# Patient Record
Sex: Male | Born: 1990 | Race: White | Hispanic: No | Marital: Single | Smoking: Current every day smoker
Health system: Southern US, Community
[De-identification: ages and names within clinical notes are randomized; demographics above are authoritative.]

## PROBLEM LIST (undated history)

## (undated) HISTORY — PX: FINGER SURGERY: SHX640

---
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
## 2011-02-10 LAB — CBC
HCT: 44.1
Hemoglobin: 14.9
MCHC: 33.8
MCV: 88.3
Platelets: 190
RBC: 5
RDW: 13.6
WBC: 6.8

## 2011-02-10 LAB — LIPID PANEL
Triglycerides: 174 — ABNORMAL HIGH
VLDL: 35

## 2011-02-10 LAB — COMPREHENSIVE METABOLIC PANEL
ALT: 26
AST: 22
Albumin: 4
Alkaline Phosphatase: 123
BUN: 12
CO2: 27
Calcium: 9.4
Chloride: 103
Creatinine, Ser: 0.88
Glucose, Bld: 104 — ABNORMAL HIGH
Potassium: 3.8
Sodium: 139
Total Bilirubin: 0.3
Total Protein: 7.1

## 2011-02-10 LAB — GAMMA GT: GGT: 21

## 2011-02-10 LAB — DIFFERENTIAL
Basophils Absolute: 0
Basophils Relative: 1
Eosinophils Absolute: 0.3
Eosinophils Relative: 5
Lymphocytes Relative: 37
Lymphs Abs: 2.5
Monocytes Absolute: 0.4
Monocytes Relative: 6
Neutro Abs: 3.5
Neutrophils Relative %: 51

## 2011-02-10 LAB — URINALYSIS, ROUTINE W REFLEX MICROSCOPIC
Bilirubin Urine: NEGATIVE
Glucose, UA: NEGATIVE
Hgb urine dipstick: NEGATIVE
Ketones, ur: NEGATIVE
Nitrite: NEGATIVE
Protein, ur: NEGATIVE
Specific Gravity, Urine: 1.04 — ABNORMAL HIGH
Urobilinogen, UA: 0.2
pH: 6

## 2011-02-10 LAB — RAPID URINE DRUG SCREEN, HOSP PERFORMED
Amphetamines: NOT DETECTED
Barbiturates: NOT DETECTED
Benzodiazepines: NOT DETECTED
Cocaine: NOT DETECTED
Opiates: NOT DETECTED
Tetrahydrocannabinol: NOT DETECTED

## 2011-02-10 LAB — ETHANOL: Alcohol, Ethyl (B): <5

## 2011-02-10 LAB — HEMOGLOBIN A1C
Hgb A1c MFr Bld: 5.7
Mean Plasma Glucose: 117

## 2011-02-10 LAB — TSH: TSH: 2.257

## 2011-08-25 ENCOUNTER — Encounter (HOSPITAL_COMMUNITY): Payer: Self-pay

## 2011-08-25 ENCOUNTER — Emergency Department (HOSPITAL_COMMUNITY)
Admission: EM | Admit: 2011-08-25 | Discharge: 2011-08-25 | Disposition: A | Discharge status: Home / Self Care | Payer: Self-pay | Attending: Emergency Medicine | Admitting: Emergency Medicine

## 2011-08-25 DIAGNOSIS — N489 Disorder of penis, unspecified: Secondary | ICD-10-CM | POA: Insufficient documentation

## 2011-08-25 DIAGNOSIS — N4830 Priapism, unspecified: Secondary | ICD-10-CM

## 2011-08-25 LAB — BASIC METABOLIC PANEL
BUN: 17 mg/dL (ref 6–23)
CO2: 26 mEq/L (ref 19–32)
Chloride: 104 mEq/L (ref 96–112)
Creatinine, Ser: 0.92 mg/dL (ref 0.50–1.35)
GFR calc Af Amer: >90 mL/min (ref >90)
Glucose, Bld: 106 mg/dL — ABNORMAL HIGH (ref 70–99)
Potassium: 3.9 mEq/L (ref 3.5–5.1)

## 2011-08-25 LAB — DIFFERENTIAL
Lymphocytes Relative: 45 % (ref 12–46)
Lymphs Abs: 2.3 10*3/uL (ref 0.7–4.0)
Monocytes Absolute: 0.4 10*3/uL (ref 0.1–1.0)
Monocytes Relative: 7 % (ref 3–12)
Neutro Abs: 2.2 10*3/uL (ref 1.7–7.7)
Neutrophils Relative %: 45 % (ref 43–77)

## 2011-08-25 LAB — CBC
HCT: 44 % (ref 39.0–52.0)
Hemoglobin: 15.7 g/dL (ref 13.0–17.0)
MCHC: 35.7 g/dL (ref 30.0–36.0)
RBC: 5.02 MIL/uL (ref 4.22–5.81)

## 2011-08-25 MED ORDER — HYDROCODONE-ACETAMINOPHEN 5-325 MG PO TABS
1.0000 | ORAL_TABLET | Freq: Once | ORAL | Status: AC
  Administered 2011-08-25: 1 via ORAL
  Filled 2011-08-25: qty 1

## 2011-08-25 NOTE — ED Notes (Signed)
Patient presents with penile pain and erection since waking this morning. Patient believe his erection was present all night while he was sleeping. Patient reports erection has decreased but is still present and painful.  Patient reporting clera discharge to penile region.

## 2011-08-25 NOTE — Discharge Instructions (Signed)
Return if problems

## 2011-08-25 NOTE — ED Notes (Signed)
Pt states woke up w/ errection that he states he thinks has been there for a few hours painful he states has a d/c

## 2011-08-25 NOTE — ED Notes (Signed)
Pt d/c home in NAD. Pt voiced understanding of d/c instrructions. Pt informed that he will receive a call if GC/Chlamydia is positive.

## 2011-08-25 NOTE — ED Notes (Signed)
MD at bedside. 

## 2011-08-25 NOTE — ED Provider Notes (Signed)
History     CSN: [id number]  Arrival date & time 08/25/11  0807   First MD Initiated Contact with Patient 08/25/11 [phone]      Chief Complaint  Patient presents with  . Penis Pain    (Consider location/radiation/quality/duration/timing/severity/associated sxs/prior treatment) Patient is a 21 y.o. male presenting with penile pain. The history is provided by the patient (The patient states that he awoke this morning with an erection. Direction does not go to). No language interpreter was used.  Penis Pain This is a new problem. The current episode started 1 to 2 hours ago. The problem occurs constantly. The problem has been gradually improving. Pertinent negatives include no chest pain, no abdominal pain and no headaches. The symptoms are aggravated by nothing. The symptoms are relieved by nothing. The treatment provided no relief.    History reviewed. No pertinent past medical history.  History reviewed. No pertinent past surgical history.  No family history on file.  History  Substance Use Topics  . Smoking status: Never Smoker   . Smokeless tobacco: Not on file  . Alcohol Use: Yes      Review of Systems  Constitutional: Negative for fatigue.  HENT: Negative for congestion, sinus pressure and ear discharge.   Eyes: Negative for discharge.  Respiratory: Negative for cough.   Cardiovascular: Negative for chest pain.  Gastrointestinal: Negative for abdominal pain and diarrhea.  Genitourinary: Positive for penile pain. Negative for frequency and hematuria.  Musculoskeletal: Negative for back pain.  Skin: Negative for rash.  Neurological: Negative for seizures and headaches.  Hematological: Negative.   Psychiatric/Behavioral: Negative for hallucinations.    Allergies  Review of patient's allergies indicates no known allergies.  Home Medications   Current Outpatient Rx  Name Route Sig Dispense Refill  . CALCIUM PO Oral Take 1 tablet by mouth daily.    . L-ARGININE PO  Oral Take 1 tablet by mouth daily.    Marland Kitchen MAGNESIUM PO Oral Take 1 tablet by mouth daily.    . ADULT MULTIVITAMIN W/MINERALS CH Oral Take 1 tablet by mouth daily.    Marland Kitchen ZINC PO Oral Take 1 tablet by mouth daily.    Marland Kitchen VITAMIN E PO Oral Take 1 tablet by mouth daily.      BP 121/72  Pulse 63  Temp 98.1 F (36.7 C)  Resp 20  SpO2 98%  Physical Exam  Constitutional: He is oriented to person, place, and time. He appears well-developed.  HENT:  Head: Normocephalic and atraumatic.  Eyes: Conjunctivae and EOM are normal. No scleral icterus.  Neck: Neck supple. No thyromegaly present.  Cardiovascular: Normal rate and regular rhythm.  Exam reveals no gallop and no friction rub.   No murmur heard. Pulmonary/Chest: No stridor. He has no wheezes. He has no rales. He exhibits no tenderness.  Abdominal: He exhibits no distension. There is no tenderness. There is no rebound.  Musculoskeletal: Normal range of motion. He exhibits no edema.  Lymphadenopathy:    He has no cervical adenopathy.  Neurological: He is oriented to person, place, and time. Coordination normal.  Skin: No rash noted. No erythema.  Psychiatric: He has a normal mood and affect. His behavior is normal.    ED Course  Procedures (including critical care time)  Labs Reviewed  BASIC METABOLIC PANEL - Abnormal; Notable for the following:    Glucose, Bld 106 (*)    All other components within normal limits  CBC  DIFFERENTIAL  GC/CHLAMYDIA PROBE AMP, GENITAL   No  results found.   1. Priapism     Resolved priapism  MDM          Benny Lennert, MD 08/25/11 1148

## 2018-01-03 ENCOUNTER — Inpatient Hospital Stay (HOSPITAL_COMMUNITY)
Admission: EM | Admit: 2018-01-03 | Discharge: 2018-01-10 | DRG: 493 | Disposition: A | Discharge status: Home Health | Payer: Self-pay | Attending: Student | Admitting: Student

## 2018-01-03 ENCOUNTER — Emergency Department (HOSPITAL_COMMUNITY): Payer: Self-pay | Admitting: Certified Registered Nurse Anesthetist

## 2018-01-03 ENCOUNTER — Encounter (HOSPITAL_COMMUNITY)
Admission: EM | Disposition: A | Discharge status: Home Health | Payer: Self-pay | Source: Home / Self Care | Attending: Student

## 2018-01-03 ENCOUNTER — Emergency Department (HOSPITAL_COMMUNITY): Payer: Self-pay

## 2018-01-03 ENCOUNTER — Other Ambulatory Visit: Payer: Self-pay

## 2018-01-03 ENCOUNTER — Encounter (HOSPITAL_COMMUNITY): Payer: Self-pay

## 2018-01-03 ENCOUNTER — Inpatient Hospital Stay (HOSPITAL_COMMUNITY): Payer: Self-pay

## 2018-01-03 DIAGNOSIS — Z6838 Body mass index (BMI) 38.0-38.9, adult: Secondary | ICD-10-CM

## 2018-01-03 DIAGNOSIS — R200 Anesthesia of skin: Secondary | ICD-10-CM | POA: Diagnosis present

## 2018-01-03 DIAGNOSIS — S82401A Unspecified fracture of shaft of right fibula, initial encounter for closed fracture: Secondary | ICD-10-CM | POA: Diagnosis present

## 2018-01-03 DIAGNOSIS — F17220 Nicotine dependence, chewing tobacco, uncomplicated: Secondary | ICD-10-CM | POA: Diagnosis present

## 2018-01-03 DIAGNOSIS — T148XXA Other injury of unspecified body region, initial encounter: Secondary | ICD-10-CM

## 2018-01-03 DIAGNOSIS — S82142A Displaced bicondylar fracture of left tibia, initial encounter for closed fracture: Secondary | ICD-10-CM

## 2018-01-03 DIAGNOSIS — S82121A Displaced fracture of lateral condyle of right tibia, initial encounter for closed fracture: Secondary | ICD-10-CM | POA: Diagnosis present

## 2018-01-03 DIAGNOSIS — W19XXXA Unspecified fall, initial encounter: Secondary | ICD-10-CM | POA: Diagnosis present

## 2018-01-03 DIAGNOSIS — Z419 Encounter for procedure for purposes other than remedying health state, unspecified: Secondary | ICD-10-CM

## 2018-01-03 DIAGNOSIS — T79A21A Traumatic compartment syndrome of right lower extremity, initial encounter: Secondary | ICD-10-CM

## 2018-01-03 DIAGNOSIS — F17210 Nicotine dependence, cigarettes, uncomplicated: Secondary | ICD-10-CM | POA: Diagnosis present

## 2018-01-03 DIAGNOSIS — S83411A Sprain of medial collateral ligament of right knee, initial encounter: Secondary | ICD-10-CM | POA: Diagnosis present

## 2018-01-03 DIAGNOSIS — E669 Obesity, unspecified: Secondary | ICD-10-CM | POA: Diagnosis present

## 2018-01-03 DIAGNOSIS — S82141A Displaced bicondylar fracture of right tibia, initial encounter for closed fracture: Principal | ICD-10-CM

## 2018-01-03 HISTORY — PX: EXTERNAL FIXATION LEG: SHX1549

## 2018-01-03 LAB — BASIC METABOLIC PANEL
ANION GAP: 13 (ref 5–15)
BUN: 10 mg/dL (ref 6–20)
CHLORIDE: 105 mmol/L (ref 98–111)
CO2: 20 mmol/L — AB (ref 22–32)
Calcium: 9 mg/dL (ref 8.9–10.3)
Creatinine, Ser: 1 mg/dL (ref 0.61–1.24)
GFR calc non Af Amer: >60 mL/min (ref >60)
Glucose, Bld: 93 mg/dL (ref 70–99)
POTASSIUM: 4.4 mmol/L (ref 3.5–5.1)
SODIUM: 138 mmol/L (ref 135–145)

## 2018-01-03 LAB — CBC WITH DIFFERENTIAL/PLATELET
ABS IMMATURE GRANULOCYTES: 0 10*3/uL (ref 0.0–0.1)
BASOS PCT: 0 %
Basophils Absolute: 0 10*3/uL (ref 0.0–0.1)
EOS ABS: 0 10*3/uL (ref 0.0–0.7)
Eosinophils Relative: 0 %
HEMATOCRIT: 42.5 % (ref 39.0–52.0)
Hemoglobin: 14.6 g/dL (ref 13.0–17.0)
IMMATURE GRANULOCYTES: 0 %
LYMPHS ABS: 1.7 10*3/uL (ref 0.7–4.0)
Lymphocytes Relative: 13 %
MCH: 30.9 pg (ref 26.0–34.0)
MCHC: 34.4 g/dL (ref 30.0–36.0)
MCV: 90 fL (ref 78.0–100.0)
MONOS PCT: 5 %
Monocytes Absolute: 0.7 10*3/uL (ref 0.1–1.0)
NEUTROS ABS: 10.8 10*3/uL — AB (ref 1.7–7.7)
Neutrophils Relative %: 82 %
PLATELETS: 229 10*3/uL (ref 150–400)
RBC: 4.72 MIL/uL (ref 4.22–5.81)
RDW: 12 % (ref 11.5–15.5)
WBC: 13.3 10*3/uL — AB (ref 4.0–10.5)

## 2018-01-03 SURGERY — EXTERNAL FIXATION, LOWER EXTREMITY
Anesthesia: General | Site: Leg Upper | Laterality: Right

## 2018-01-03 MED ORDER — ALBUTEROL SULFATE HFA 108 (90 BASE) MCG/ACT IN AERS
INHALATION_SPRAY | RESPIRATORY_TRACT | Status: DC | PRN
  Administered 2018-01-03: 6 via RESPIRATORY_TRACT

## 2018-01-03 MED ORDER — CEFAZOLIN SODIUM 1 G IJ SOLR
INTRAMUSCULAR | Status: AC
  Filled 2018-01-03: qty 20

## 2018-01-03 MED ORDER — LACTATED RINGERS IV SOLN
INTRAVENOUS | Status: DC | PRN
  Administered 2018-01-03 (×2): via INTRAVENOUS

## 2018-01-03 MED ORDER — SODIUM CHLORIDE 0.9 % IV SOLN
INTRAVENOUS | Status: DC
  Administered 2018-01-03 – 2018-01-09 (×4): via INTRAVENOUS

## 2018-01-03 MED ORDER — MIDAZOLAM HCL 5 MG/5ML IJ SOLN
INTRAMUSCULAR | Status: DC | PRN
  Administered 2018-01-03: 2 mg via INTRAVENOUS

## 2018-01-03 MED ORDER — PANTOPRAZOLE SODIUM 40 MG PO TBEC
40.0000 mg | DELAYED_RELEASE_TABLET | Freq: Every day | ORAL | Status: DC
  Administered 2018-01-03 – 2018-01-10 (×6): 40 mg via ORAL
  Filled 2018-01-03 (×5): qty 1

## 2018-01-03 MED ORDER — CEFAZOLIN SODIUM-DEXTROSE 2-3 GM-%(50ML) IV SOLR
INTRAVENOUS | Status: DC | PRN
  Administered 2018-01-03: 2 g via INTRAVENOUS

## 2018-01-03 MED ORDER — SUGAMMADEX SODIUM 200 MG/2ML IV SOLN
INTRAVENOUS | Status: DC | PRN
  Administered 2018-01-03: 400 mg via INTRAVENOUS

## 2018-01-03 MED ORDER — HYDROMORPHONE HCL 1 MG/ML IJ SOLN
0.2500 mg | INTRAMUSCULAR | Status: DC | PRN
  Administered 2018-01-03 (×4): 0.5 mg via INTRAVENOUS

## 2018-01-03 MED ORDER — MIDAZOLAM HCL 2 MG/2ML IJ SOLN
INTRAMUSCULAR | Status: AC
  Filled 2018-01-03: qty 2

## 2018-01-03 MED ORDER — DIPHENHYDRAMINE HCL 12.5 MG/5ML PO ELIX
12.5000 mg | ORAL_SOLUTION | ORAL | Status: DC | PRN
  Administered 2018-01-04 – 2018-01-05 (×2): 25 mg via ORAL
  Filled 2018-01-03 (×3): qty 10

## 2018-01-03 MED ORDER — PROPOFOL 10 MG/ML IV BOLUS
INTRAVENOUS | Status: DC | PRN
  Administered 2018-01-03: 150 mg via INTRAVENOUS

## 2018-01-03 MED ORDER — 0.9 % SODIUM CHLORIDE (POUR BTL) OPTIME
TOPICAL | Status: DC | PRN
  Administered 2018-01-03: 1000 mL

## 2018-01-03 MED ORDER — OXYCODONE HCL 5 MG PO TABS
ORAL_TABLET | ORAL | Status: AC
  Administered 2018-01-03: 5 mg via ORAL
  Filled 2018-01-03: qty 2

## 2018-01-03 MED ORDER — METHOCARBAMOL 1000 MG/10ML IJ SOLN
500.0000 mg | Freq: Four times a day (QID) | INTRAVENOUS | Status: DC | PRN
  Filled 2018-01-03: qty 5

## 2018-01-03 MED ORDER — METHOCARBAMOL 500 MG PO TABS
ORAL_TABLET | ORAL | Status: AC
  Filled 2018-01-03: qty 1

## 2018-01-03 MED ORDER — OXYCODONE HCL 5 MG PO TABS
10.0000 mg | ORAL_TABLET | ORAL | Status: DC | PRN
  Administered 2018-01-03 – 2018-01-04 (×5): 15 mg via ORAL
  Administered 2018-01-05: 10 mg via ORAL
  Administered 2018-01-05: 15 mg via ORAL
  Administered 2018-01-05: 10 mg via ORAL
  Administered 2018-01-06 (×2): 15 mg via ORAL
  Administered 2018-01-06 – 2018-01-07 (×2): 10 mg via ORAL
  Administered 2018-01-07 – 2018-01-09 (×9): 15 mg via ORAL
  Administered 2018-01-10: 10 mg via ORAL
  Administered 2018-01-10 (×2): 15 mg via ORAL
  Filled 2018-01-03: qty 3
  Filled 2018-01-03: qty 2
  Filled 2018-01-03 (×20): qty 3

## 2018-01-03 MED ORDER — METOCLOPRAMIDE HCL 5 MG PO TABS: 5.0000 mg | ORAL_TABLET | Freq: Three times a day (TID) | ORAL | Status: DC | PRN

## 2018-01-03 MED ORDER — ROCURONIUM BROMIDE 100 MG/10ML IV SOLN
INTRAVENOUS | Status: DC | PRN
  Administered 2018-01-03: 20 mg via INTRAVENOUS
  Administered 2018-01-03: 50 mg via INTRAVENOUS

## 2018-01-03 MED ORDER — HYDROMORPHONE HCL 1 MG/ML IJ SOLN
1.0000 mg | INTRAMUSCULAR | Status: DC | PRN
  Administered 2018-01-03 – 2018-01-05 (×13): 2 mg via INTRAVENOUS
  Administered 2018-01-05: 1 mg via INTRAVENOUS
  Administered 2018-01-05 – 2018-01-06 (×5): 2 mg via INTRAVENOUS
  Administered 2018-01-06 (×2): 1 mg via INTRAVENOUS
  Administered 2018-01-06: 2 mg via INTRAVENOUS
  Administered 2018-01-07 (×3): 1 mg via INTRAVENOUS
  Administered 2018-01-08 (×4): 2 mg via INTRAVENOUS
  Administered 2018-01-08: 1 mg via INTRAVENOUS
  Administered 2018-01-08 – 2018-01-10 (×9): 2 mg via INTRAVENOUS
  Administered 2018-01-10: 1 mg via INTRAVENOUS
  Administered 2018-01-10: 2 mg via INTRAVENOUS
  Filled 2018-01-03 (×9): qty 2
  Filled 2018-01-03: qty 1
  Filled 2018-01-03: qty 2
  Filled 2018-01-03: qty 1
  Filled 2018-01-03 (×15): qty 2
  Filled 2018-01-03 (×2): qty 1
  Filled 2018-01-03 (×6): qty 2
  Filled 2018-01-03: qty 1
  Filled 2018-01-03 (×4): qty 2
  Filled 2018-01-03: qty 1
  Filled 2018-01-03: qty 2

## 2018-01-03 MED ORDER — HYDROMORPHONE HCL 1 MG/ML IJ SOLN
1.0000 mg | Freq: Once | INTRAMUSCULAR | Status: AC
  Administered 2018-01-03: 1 mg via INTRAMUSCULAR
  Filled 2018-01-03: qty 1

## 2018-01-03 MED ORDER — MEPERIDINE HCL 50 MG/ML IJ SOLN: 6.2500 mg | INTRAMUSCULAR | Status: DC | PRN

## 2018-01-03 MED ORDER — CEFAZOLIN SODIUM-DEXTROSE 1-4 GM/50ML-% IV SOLN
1.0000 g | Freq: Four times a day (QID) | INTRAVENOUS | Status: AC
  Administered 2018-01-03 – 2018-01-04 (×3): 1 g via INTRAVENOUS
  Filled 2018-01-03 (×4): qty 50

## 2018-01-03 MED ORDER — ONDANSETRON HCL 4 MG PO TABS: 4.0000 mg | ORAL_TABLET | Freq: Four times a day (QID) | ORAL | Status: DC | PRN

## 2018-01-03 MED ORDER — SUGAMMADEX SODIUM 500 MG/5ML IV SOLN
INTRAVENOUS | Status: AC
  Filled 2018-01-03: qty 5

## 2018-01-03 MED ORDER — HYDROMORPHONE HCL 1 MG/ML IJ SOLN
1.0000 mg | INTRAMUSCULAR | Status: DC | PRN
  Administered 2018-01-03: 1 mg via INTRAVENOUS
  Filled 2018-01-03: qty 1

## 2018-01-03 MED ORDER — METOCLOPRAMIDE HCL 5 MG/ML IJ SOLN: 5.0000 mg | Freq: Three times a day (TID) | INTRAMUSCULAR | Status: DC | PRN

## 2018-01-03 MED ORDER — HYDROMORPHONE HCL 1 MG/ML IJ SOLN
INTRAMUSCULAR | Status: AC
  Administered 2018-01-03: 0.5 mg via INTRAVENOUS
  Filled 2018-01-03: qty 1

## 2018-01-03 MED ORDER — OXYCODONE HCL 5 MG PO TABS
5.0000 mg | ORAL_TABLET | ORAL | Status: DC | PRN
  Administered 2018-01-03: 5 mg via ORAL
  Administered 2018-01-07 (×2): 10 mg via ORAL
  Filled 2018-01-03 (×6): qty 2

## 2018-01-03 MED ORDER — ONDANSETRON HCL 4 MG/2ML IJ SOLN
4.0000 mg | Freq: Four times a day (QID) | INTRAMUSCULAR | Status: DC | PRN
  Administered 2018-01-08: 4 mg via INTRAVENOUS

## 2018-01-03 MED ORDER — DEXMEDETOMIDINE HCL IN NACL 200 MCG/50ML IV SOLN
INTRAVENOUS | Status: AC
  Filled 2018-01-03: qty 50

## 2018-01-03 MED ORDER — LIDOCAINE HCL (CARDIAC) PF 100 MG/5ML IV SOSY
PREFILLED_SYRINGE | INTRAVENOUS | Status: DC | PRN
  Administered 2018-01-03: 100 mg via INTRAVENOUS

## 2018-01-03 MED ORDER — HYDROMORPHONE HCL 1 MG/ML IJ SOLN: 1.0000 mg | INTRAMUSCULAR | Status: DC | PRN

## 2018-01-03 MED ORDER — SUCCINYLCHOLINE CHLORIDE 20 MG/ML IJ SOLN
INTRAMUSCULAR | Status: DC | PRN
  Administered 2018-01-03: 120 mg via INTRAVENOUS

## 2018-01-03 MED ORDER — ONDANSETRON HCL 4 MG/2ML IJ SOLN
INTRAMUSCULAR | Status: DC | PRN
  Administered 2018-01-03: 4 mg via INTRAVENOUS

## 2018-01-03 MED ORDER — METHOCARBAMOL 500 MG PO TABS
500.0000 mg | ORAL_TABLET | Freq: Four times a day (QID) | ORAL | Status: DC | PRN
  Administered 2018-01-03 – 2018-01-10 (×14): 500 mg via ORAL
  Filled 2018-01-03 (×17): qty 1

## 2018-01-03 MED ORDER — GABAPENTIN 100 MG PO CAPS
100.0000 mg | ORAL_CAPSULE | Freq: Three times a day (TID) | ORAL | Status: DC
  Administered 2018-01-03: 100 mg via ORAL
  Filled 2018-01-03 (×2): qty 1

## 2018-01-03 MED ORDER — ONDANSETRON HCL 4 MG/2ML IJ SOLN: 4.0000 mg | Freq: Once | INTRAMUSCULAR | Status: DC | PRN

## 2018-01-03 MED ORDER — GABAPENTIN 300 MG PO CAPS
300.0000 mg | ORAL_CAPSULE | Freq: Three times a day (TID) | ORAL | Status: DC
  Administered 2018-01-03 – 2018-01-10 (×18): 300 mg via ORAL
  Filled 2018-01-03 (×18): qty 1

## 2018-01-03 MED ORDER — FENTANYL CITRATE (PF) 250 MCG/5ML IJ SOLN
INTRAMUSCULAR | Status: AC
  Filled 2018-01-03: qty 5

## 2018-01-03 MED ORDER — HYDROMORPHONE HCL 1 MG/ML IJ SOLN
0.5000 mg | INTRAMUSCULAR | Status: DC | PRN
  Administered 2018-01-03: 1 mg via INTRAVENOUS
  Filled 2018-01-03: qty 1

## 2018-01-03 MED ORDER — ENOXAPARIN SODIUM 40 MG/0.4ML ~~LOC~~ SOLN
40.0000 mg | SUBCUTANEOUS | Status: DC
  Administered 2018-01-04 – 2018-01-10 (×5): 40 mg via SUBCUTANEOUS
  Filled 2018-01-03 (×5): qty 0.4

## 2018-01-03 MED ORDER — PROPOFOL 10 MG/ML IV BOLUS
INTRAVENOUS | Status: AC
  Filled 2018-01-03: qty 20

## 2018-01-03 MED ORDER — DOCUSATE SODIUM 100 MG PO CAPS
100.0000 mg | ORAL_CAPSULE | Freq: Two times a day (BID) | ORAL | Status: DC
  Administered 2018-01-03 – 2018-01-10 (×13): 100 mg via ORAL
  Filled 2018-01-03 (×13): qty 1

## 2018-01-03 MED ORDER — FENTANYL CITRATE (PF) 100 MCG/2ML IJ SOLN
INTRAMUSCULAR | Status: DC | PRN
  Administered 2018-01-03: 100 ug via INTRAVENOUS
  Administered 2018-01-03 (×3): 50 ug via INTRAVENOUS
  Administered 2018-01-03 (×2): 100 ug via INTRAVENOUS

## 2018-01-03 MED ORDER — ACETAMINOPHEN 325 MG PO TABS
325.0000 mg | ORAL_TABLET | Freq: Four times a day (QID) | ORAL | Status: DC | PRN
  Administered 2018-01-04: 650 mg via ORAL
  Filled 2018-01-03 (×2): qty 2

## 2018-01-03 SURGICAL SUPPLY — 50 items
BANDAGE ACE 6X5 VEL STRL LF (GAUZE/BANDAGES/DRESSINGS) ×6 IMPLANT
BAR EXFX 500X11 NS LF (EXFIX) ×2
BAR GLASS FIBER EXFX 11X500 (EXFIX) ×4 IMPLANT
BNDG GAUZE ELAST 4 BULKY (GAUZE/BANDAGES/DRESSINGS) ×2 IMPLANT
CANISTER WOUND CARE 500ML ATS (WOUND CARE) ×2 IMPLANT
CELLS DAT CNTRL 66122 CELL SVR (MISCELLANEOUS) ×1 IMPLANT
COVER SURGICAL LIGHT HANDLE (MISCELLANEOUS) ×3 IMPLANT
DRAPE C-ARM 42X72 X-RAY (DRAPES) ×3 IMPLANT
DRAPE C-ARMOR (DRAPES) ×2 IMPLANT
DRAPE ORTHO SPLIT 77X108 STRL (DRAPES) ×6
DRAPE SURG ORHT 6 SPLT 77X108 (DRAPES) ×2 IMPLANT
DRAPE U-SHAPE 47X51 STRL (DRAPES) ×5 IMPLANT
DURAPREP 26ML APPLICATOR (WOUND CARE) ×3 IMPLANT
ELECT REM PT RETURN 9FT ADLT (ELECTROSURGICAL)
ELECTRODE REM PT RTRN 9FT ADLT (ELECTROSURGICAL) IMPLANT
GAUZE SPONGE 4X4 12PLY STRL (GAUZE/BANDAGES/DRESSINGS) ×5 IMPLANT
GAUZE XEROFORM 1X8 LF (GAUZE/BANDAGES/DRESSINGS) ×2 IMPLANT
GAUZE XEROFORM 5X9 LF (GAUZE/BANDAGES/DRESSINGS) ×1 IMPLANT
GLOVE BIO SURGEON STRL SZ8 (GLOVE) ×3 IMPLANT
GLOVE BIOGEL PI IND STRL 7.0 (GLOVE) IMPLANT
GLOVE BIOGEL PI INDICATOR 7.0 (GLOVE) ×4
GLOVE ORTHO TXT STRL SZ7.5 (GLOVE) ×3 IMPLANT
GOWN STRL REUS W/ TWL LRG LVL3 (GOWN DISPOSABLE) ×1 IMPLANT
GOWN STRL REUS W/ TWL XL LVL3 (GOWN DISPOSABLE) ×4 IMPLANT
GOWN STRL REUS W/TWL LRG LVL3 (GOWN DISPOSABLE) ×3
GOWN STRL REUS W/TWL XL LVL3 (GOWN DISPOSABLE) ×6
KIT BASIN OR (CUSTOM PROCEDURE TRAY) ×3 IMPLANT
KIT TURNOVER KIT B (KITS) ×3 IMPLANT
MANIFOLD NEPTUNE II (INSTRUMENTS) ×1 IMPLANT
NS IRRIG 1000ML POUR BTL (IV SOLUTION) ×3 IMPLANT
PACK ORTHO EXTREMITY (CUSTOM PROCEDURE TRAY) ×3 IMPLANT
PAD ABD 8X10 STRL (GAUZE/BANDAGES/DRESSINGS) ×8 IMPLANT
PAD ARMBOARD 7.5X6 YLW CONV (MISCELLANEOUS) ×6 IMPLANT
PADDING CAST COTTON 6X4 STRL (CAST SUPPLIES) ×3 IMPLANT
PIN CLAMP 2BAR 75MM BLUE (EXFIX) ×4 IMPLANT
PIN HALF ORANGE 5X200X45MM (EXFIX) ×4 IMPLANT
PIN HALF YELLOW 5X160X35 (EXFIX) ×4 IMPLANT
RETRACTOR WND ALEXIS 18 MED (MISCELLANEOUS) IMPLANT
RTRCTR WOUND ALEXIS 18CM MED (MISCELLANEOUS) ×3
SPONGE LAP 18X18 X RAY DECT (DISPOSABLE) ×3 IMPLANT
STOCKINETTE IMPERVIOUS LG (DRAPES) ×2 IMPLANT
SUT ETHILON 2 0 FS 18 (SUTURE) IMPLANT
SUT ETHILON 2 0 PSLX (SUTURE) ×4 IMPLANT
SUT VIC AB 2-0 CT1 27 (SUTURE)
SUT VIC AB 2-0 CT1 TAPERPNT 27 (SUTURE) IMPLANT
TOWEL OR 17X24 6PK STRL BLUE (TOWEL DISPOSABLE) ×3 IMPLANT
TOWEL OR 17X26 10 PK STRL BLUE (TOWEL DISPOSABLE) ×3 IMPLANT
UNDERPAD 30X30 (UNDERPADS AND DIAPERS) ×3 IMPLANT
WATER STERILE IRR 1000ML POUR (IV SOLUTION) ×3 IMPLANT
YANKAUER SUCT BULB TIP NO VENT (SUCTIONS) ×2 IMPLANT

## 2018-01-03 NOTE — Anesthesia Procedure Notes (Signed)
Procedure Name: Intubation Date/Time: 01/03/2018 8:24 AM Performed by: Urie Loughner T, CRNA Pre-anesthesia Checklist: Patient identified, Emergency Drugs available, Suction available and Patient being monitored Patient Re-evaluated:Patient Re-evaluated prior to induction Oxygen Delivery Method: Circle system utilized Preoxygenation: Pre-oxygenation with 100% oxygen Induction Type: IV induction Ventilation: Mask ventilation without difficulty Laryngoscope Size: Mac and 4 Grade View: Grade I Tube type: Oral Tube size: 7.5 mm Number of attempts: 1 Airway Equipment and Method: Patient positioned with wedge pillow and Stylet Placement Confirmation: ETT inserted through vocal cords under direct vision,  positive ETCO2 and breath sounds checked- equal and bilateral Secured at: 21 cm Tube secured with: Tape Dental Injury: Teeth and Oropharynx as per pre-operative assessment

## 2018-01-03 NOTE — ED Provider Notes (Addendum)
[facility] EMERGENCY DEPARTMENT Provider Note   CSN: [id number] Arrival date & time: 01/03/18  0216     History   Chief Complaint Chief Complaint  Patient presents with  . Knee Pain    HPI [name] is a 27 y.o. male.  HPI  27 year old male comes in with chief complaint of knee pain. Patient reports that around 830 his right knee gave out and he fell down.  Patient had instant pain.  Patient was in [place], and his family when they are to pick him up and brought him to the emergency room.  Patient denies any history of injury to that leg in the past.  He admits to drinking earlier in the evening.  Patient has numbness over the plantar aspect of the right foot.   History reviewed. No pertinent past medical history.  There are no active problems to display for this patient.   Past Surgical History:  Procedure Laterality Date  . FINGER SURGERY          Home Medications    Prior to Admission medications   Not on File    Family History History reviewed. No pertinent family history.  Social History Social History   Tobacco Use  . Smoking status: Current Every Day Smoker    Packs/day: 1.00  . Smokeless tobacco: Current User    Types: Chew  Substance Use Topics  . Alcohol use: Yes    Comment: 3-4 beers/weekend   . Drug use: No     Allergies   Patient has no known allergies.   Review of Systems Review of Systems  Constitutional: Positive for activity change.  Respiratory: Negative for shortness of breath.   Cardiovascular: Negative for chest pain.  Musculoskeletal: Positive for myalgias.  Skin: Negative for wound.  Neurological: Negative for headaches.  Hematological: Does not bruise/bleed easily.     Physical Exam Updated Vital Signs BP (!) 129/59   Pulse 66   Temp 99 F (37.2 C)   Resp 17   Ht 5\' 10"  (1.778 m)   Wt 120.2 kg   SpO2 95%   BMI 38.02 kg/m   Physical Exam  Constitutional: He is oriented to  person, place, and time. He appears well-developed.  HENT:  Head: Atraumatic.  Neck: Neck supple.  Cardiovascular: Normal rate.  Pulmonary/Chest: Effort normal.  Musculoskeletal:  Right lower extremity is grossly swollen.  Patient has tenderness over the knee and distal thigh. Patient's compartments over the calf region are firm but not tight. Right lower extremity is warm to touch, patient is 2+ and equal dorsalis pedis. Patient is able to discriminate between sharp and dull, and his sharp sensation is actually more pronounced on the injured foot compared to the contralateral side.  Neurological: He is alert and oriented to person, place, and time.  Skin: Skin is warm.  Nursing note and vitals reviewed.    ED Treatments / Results  Labs (all labs ordered are listed, but only abnormal results are displayed) Labs Reviewed  BASIC METABOLIC PANEL - Abnormal; Notable for the following components:      Result Value   CO2 20 (*)    All other components within normal limits  CBC WITH DIFFERENTIAL/PLATELET - Abnormal; Notable for the following components:   WBC 13.3 (*)    Neutro Abs 10.8 (*)    All other components within normal limits    EKG EKG Interpretation  Date/Time:  Sunday January 03 2018 04:46:02 EDT Ventricular Rate:  69 PR Interval:    QRS Duration: 90 QT Interval:  400 QTC Calculation: 429 R Axis:   16 Text Interpretation:  Sinus rhythm No acute changes No significant change since last tracing Confirmed by Derwood KaplanNanavati, Julya Alioto [phone]([id number]) on 01/03/2018 5:30:20 AM   Radiology Dg Tibia/fibula Right  Result Date: 01/03/2018 CLINICAL DATA:  Fall EXAM: RIGHT TIBIA AND FIBULA - 2 VIEW COMPARISON:  None. FINDINGS: Acute fracture involving proximal shaft of the fibula at the junction of the proximal and middle thirds with about 1/3 bone with of anterior displacement of distal fracture fragment. Additional acute mildly displaced distal fibular fracture. Acute comminuted intra-articular  proximal tibial fracture with wide separation of fracture fragments and impaction of the lateral femoral condyle into the fracture gap. Acute displaced fracture involving the lateral femoral condyle. Large knee effusion. IMPRESSION: 1. Acute comminuted intra-articular fracture of the proximal tibia with wide separation of fracture fragments 2. Acute mildly displaced fracture involving the lateral femoral condyle 3. Acute mildly displaced fractures involving the proximal shaft of the fibula and the distal shaft and metaphysis of the fibula. Electronically Signed   By: Jasmine PangKim  Fujinaga M.D.   On: 01/03/2018 03:23   Dg Knee Complete 4 Views Right  Result Date: 01/03/2018 CLINICAL DATA:  Fall, twisting injury severe knee pain EXAM: RIGHT KNEE - COMPLETE 4+ VIEW COMPARISON:  None. FINDINGS: Large knee effusion. Acute comminuted fracture involving the proximal tibia with 2.3 cm separation of dominant lateral fracture fragment and impaction of the lateral femoral condyle between the proximal tibial fracture fragments. Acute mildly comminuted appearing and displaced fracture involving the lateral femoral condyle. Acute fracture proximal shaft of fibula with 1/4 bone with of anterior displacement of distal fracture fragment. IMPRESSION: 1. Acute comminuted intra-articular proximal tibial fracture with impaction of the lateral femoral condyle between the dominant proximal tibial fracture fragments. 2.4 cm separation of proximal tibial fracture fragments. Additional mildly comminuted fracture involving the lateral femoral condyle. 2. Acute mildly fracture proximal shaft of the fibula. Electronically Signed   By: Jasmine PangKim  Fujinaga M.D.   On: 01/03/2018 03:20   Dg Foot 2 Views Right  Result Date: 01/03/2018 CLINICAL DATA:  Fall EXAM: RIGHT FOOT - 2 VIEW COMPARISON:  None. FINDINGS: No fracture or malalignment at the foot. Acute mildly displaced distal fibular fracture. IMPRESSION: No acute osseous abnormality of the foot.   Distal fibular fracture Electronically Signed   By: Jasmine PangKim  Fujinaga M.D.   On: 01/03/2018 03:24    Procedures Procedures (including critical care time)  Medications Ordered in ED Medications  HYDROmorphone (DILAUDID) injection 1 mg (1 mg Intravenous Given 01/03/18 0556)  HYDROmorphone (DILAUDID) injection 1 mg (1 mg Intramuscular Given 01/03/18 0332)     Initial Impression / Assessment and Plan / ED Course  I have reviewed the triage vital signs and the nursing notes.  Pertinent labs & imaging results that were available during my care of the patient were reviewed by me and considered in my medical decision making (see chart for details).  Clinical Course as of Jan 04 652  Wynelle LinkSun Jan 03, 2018  0530 Patient reassessed, unchanged exam.   [AN]  [phone] Patient reassessed, he is sleeping.  Denies any numbness or tingling.  Swelling does appear to be slightly worse and the posterior compartment is more firm -but it is still compressible.   [AN]    Clinical Course User Index [AN] Derwood KaplanNanavati, Mehr Depaoli, MD    Young man without significant medical history comes in after a fall.  As result of his fall he has a complex right tibial and fibular fracture.  Patient is at high risk of compartment syndrome, although at this time he only has subjective paresthesias to the sole.  I spoke with Dr. Rayburn Ma, orthopedics.  Dr. Rayburn Ma will come and see the patient in the morning.  For now he recommends ice and elevation.  Nurse has been informed to get neurovascular checks every hour.  Final Clinical Impressions(s) / ED Diagnoses   Final diagnoses:  Closed fracture of right tibial plateau, initial encounter  Closed fracture of shaft of right fibula, unspecified fracture morphology, initial encounter    ED Discharge Orders    None       Derwood Kaplan, MD 01/03/18 0441       Derwood Kaplan, MD 01/03/18 [phone]

## 2018-01-03 NOTE — Progress Notes (Signed)
Pt complained of tightness, tingling and numbness on RLE, pain at 9/10 after pain meds were given. Ice applied, extremity elevated, MD notified. Will continue to monitor.

## 2018-01-03 NOTE — Progress Notes (Signed)
PRN pain med given, ice packs in place, RLE elevated on 3 pillows, +1 pulse on R dorsalis pedis, Pt able to wiggle toes, pain 4/10, Pt states relief and feels a little better. Will continue to monitor.

## 2018-01-03 NOTE — Op Note (Signed)
NAME: [name], STROTHERS MEDICAL RECORD ZO:[id number] ACCOUNT [id number] DATE OF BIRTH:[birth year] FACILITY: [facility] LOCATION: [facility] PHYSICIAN:Atzel Mccambridge [name], MD  OPERATIVE REPORT  DATE OF PROCEDURE:  01/03/2018  PREOPERATIVE DIAGNOSES: 1.  Right complex closed and displaced tibial plateau fracture. 2.  Impending compartment syndrome, right lower extremity.  POSTOPERATIVE DIAGNOSIS:  1.  Right complex closed and displaced tibial plateau fracture. 2.  Impending compartment syndrome, right lower extremity.  PROCEDURE: 1.  External fixation, right lower extremity expanding the knee joint. 2.  Four compartment fasciotomies, right leg. 3.  Placement of the lateral decubital VAC sponge over her right lateral leg fasciotomy wound.  SURGEON:  Vanita Panda. Magnus Ivan, MD  ANESTHESIA:  General.  ESTIMATED BLOOD LOSS:  Less  than 200.  COMPLICATIONS:  None.  INDICATIONS:  The patient is a 27 year old gentleman who last evening sustained some type of twisting injury with a mechanical fall onto his right knee.  This happened actually in [place], [place] around 8:30 in the evening.  After continued  significant knee pain and inability to ambulate, he was placed in a car by his family and they drove all the way back to [place].  They then came to the [facility] Emergency Room in the early morning hours today.  In the emergency room, he was found  via x-ray to have a complex tibial plateau fracture of the proximal right tibia.  He has a swollen calf and just some slight numbness in his foot  on exam.  He could extend his great toe, but he certainly had difficulty with bending his foot.  I did get  him to dorsiflex just slightly.  He had a strong palpable pulse in his foot, but due to the swelling and the need for external fixation for ligamentotaxis to get the femur off of the tibia on the lateral aspect, I recommended urgent external fixation to  get him out to length and then  four-compartment fasciotomies.  The risks and benefits of surgery were explained to him and his mother and he did agree to proceed with surgery.  DESCRIPTION OF PROCEDURE:  After informed consent was obtained and appropriate right knee was marked.  He was brought to the operating room and placed supine on the radiolucent operating table.  General anesthesia was then obtained.  I then quickly put  external fixation pins in the anterior lateral femur, with 2 pins bicortical and the femur and then 2 pins down and the tibia.  I then performed 4 compartment fasciotomies medially and laterally.  I did find viable muscle and released the anterior and  lateral compartments laterally and then medially released the superficial and deep posterior compartments.  There was hematoma in these areas.  Fortunately, the medial side was not tight.  I then placed my external fixation spanning the knee joint under  direct fluoroscopy, was able to pull him out to an acceptable length and I put just a slight varus a stress on the knee.  Once I was pleased with the alignment, we locked down the external fixation.  I then irrigated the fasciotomy sites and the medial  side.  I was able to close loosely with interrupted 2-0 nylon suture.  The lateral side,  I did place a VAC sponge over the fasciotomy wound and set this to suction and then placed the [facility] machine and got a good seal at -125 mm of pressure.  A  well-padded sterile dressing was applied.    He was awakened, extubated, and  taken to recovery room in stable condition.  All final counts were correct.  There were no complications noted.     Postoperatively, he will be admitted and started on blood thinners tomorrow.  We will obtain our CT scan of his right knee for definitive surgical planning at a later date.  I will hopefully be able to [facility]opedic traumat[facility] due to the complex nature of this injury.  AN/NUANCE  D:01/03/2018  T:01/03/2018 JOB:[id number]

## 2018-01-03 NOTE — ED Notes (Signed)
Mother Eunice Blasedebbie [phone]

## 2018-01-03 NOTE — Plan of Care (Signed)

## 2018-01-03 NOTE — ED Triage Notes (Signed)
Pt arrive POV for eval of R knee/lower leg pain s/p fall 4 hours PTA. Pt reports he had his leg planted twisted and fell. Pt endorses severe R knee lower leg pain w/ diminshed sensation to R foot. +CSM to right foot.

## 2018-01-03 NOTE — Transfer of Care (Signed)
Immediate Anesthesia Transfer of Care Note  Patient: [name]  Procedure(s) Performed: EXTERNAL FIXATION LEG SPANNING RIGHT KNEE, POSSIBLE 4 COMPARTMENT SYNDROME (Right Leg Upper)  Patient Location: PACU  Anesthesia Type:General  Level of Consciousness: awake, alert  and oriented  Airway & Oxygen Therapy: Patient Spontanous Breathing and Patient connected to face mask oxygen  Post-op Assessment: Report given to RN, Post -op Vital signs reviewed and stable and Patient moving all extremities  Post vital signs: Reviewed and stable  Last Vitals:  Vitals Value Taken Time  BP 150/66 01/03/2018 10:05 AM  Temp    Pulse 76 01/03/2018 10:09 AM  Resp 27 01/03/2018 10:09 AM  SpO2 95 % 01/03/2018 10:09 AM  Vitals shown include unvalidated device data.  Last Pain:  Vitals:   01/03/18 0619  PainSc: 4          Complications: No apparent anesthesia complications

## 2018-01-03 NOTE — Anesthesia Preprocedure Evaluation (Addendum)
Anesthesia Evaluation  Patient identified by MRN, date of birth, ID band Patient awake    Reviewed: Allergy & Precautions, NPO status , Patient's Chart, lab work & pertinent test results  Airway Mallampati: II  TM Distance: >3 FB Neck ROM: Full    Dental  (+) Chipped, Dental Advisory Given,    Pulmonary Current Smoker,    Pulmonary exam normal        Cardiovascular Normal cardiovascular exam     Neuro/Psych    GI/Hepatic   Endo/Other    Renal/GU      Musculoskeletal   Abdominal   Peds  Hematology   Anesthesia Other Findings   Reproductive/Obstetrics                            Anesthesia Physical Anesthesia Plan  ASA: II and emergent  Anesthesia Plan: General   Post-op Pain Management:    Induction: Intravenous  PONV Risk Score and Plan: 1 and Ondansetron, Midazolam and Treatment may vary due to age or medical condition  Airway Management Planned: Oral ETT  Additional Equipment:   Intra-op Plan:   Post-operative Plan: Extubation in OR  Informed Consent: I have reviewed the patients History and Physical, chart, labs and discussed the procedure including the risks, benefits and alternatives for the proposed anesthesia with the patient or authorized representative who has indicated his/her understanding and acceptance.   Dental advisory given  Plan Discussed with: CRNA and Surgeon  Anesthesia Plan Comments:        Anesthesia Quick Evaluation

## 2018-01-03 NOTE — Anesthesia Postprocedure Evaluation (Signed)
Anesthesia Post Note  Patient: Jacky KindleBoyd E Graser  Procedure(s) Performed: EXTERNAL FIXATION LEG SPANNING RIGHT KNEE, POSSIBLE 4 COMPARTMENT SYNDROME (Right Leg Upper)     Patient location during evaluation: PACU Anesthesia Type: General Level of consciousness: awake and alert Pain management: pain level controlled Vital Signs Assessment: post-procedure vital signs reviewed and stable Respiratory status: spontaneous breathing, nonlabored ventilation, respiratory function stable and patient connected to nasal cannula oxygen Cardiovascular status: blood pressure returned to baseline and stable Postop Assessment: no apparent nausea or vomiting Anesthetic complications: no    Last Vitals:  Vitals:   01/03/18 1040 01/03/18 1045  BP:  (!) 141/65  Pulse: 86 76  Resp: 19 17  Temp:    SpO2: 95% 99%    Last Pain:  Vitals:   01/03/18 1055  PainSc: Asleep                 Breawna Montenegro DAVID

## 2018-01-03 NOTE — H&P (Signed)
[name] is an 27 y.o. male.   Chief Complaint: right knee and leg pain; known fracture HPI: The patient is a 27 year old gentleman who sustained some type of twisting injury with a mechanical heart fall last evening while in [place], [place].  This happened about 8:30 last evening.  He then had inability to ambulate and severe pain but was placed in a car by his family and driven all the way back to [place].  He arrived at our emergency room at [facility] after midnight.  I was called about 330 this morning as a consultation.  The EDP appropriately assessed the patient and had him ice and elevate his leg and to like it there for further evaluation and treatment.  At that time he denies any evidence of compartment syndrome other than some slight numbness of the foot that was noted.  On exam this morning he reports still the same numbness in his foot and pain in the knee but is been able to rest some in the evening.  He is been n.p.o.  The x-rays were assessed and found a significant right tibial plateau fracture with impaction of the femoral condyle on the lateral tibial plateau and the need for external fixation to bring the leg out to length.  He is developing a little bit more numbness in his foot and I am concerned about impending compartment syndrome and the need for fasciotomies.  His mother is at the bedside as well.  History reviewed. No pertinent past medical history.  Past Surgical History:  Procedure Laterality Date  . FINGER SURGERY      History reviewed. No pertinent family history. Social History:  reports that he has been smoking. He has been smoking about 1.00 pack per day. His smokeless tobacco use includes chew. He reports that he drinks alcohol. He reports that he does not use drugs.  Allergies: No Known Allergies   (Not in a hospital admission)  Results for orders placed or performed during the hospital encounter of 01/03/18 (from the past 48 hour(s))  Basic  metabolic panel     Status: Abnormal   Collection Time: 01/03/18  4:47 AM  Result Value Ref Range   Sodium 138 135 - 145 mmol/L   Potassium 4.4 3.5 - 5.1 mmol/L   Chloride 105 98 - 111 mmol/L   CO2 20 (L) 22 - 32 mmol/L   Glucose, Bld 93 70 - 99 mg/dL   BUN 10 6 - 20 mg/dL   Creatinine, Ser 1.00 0.61 - 1.24 mg/dL   Calcium 9.0 8.9 - 10.3 mg/dL   GFR calc non Af Amer >60 >60 mL/min   GFR calc Af Amer >60 >60 mL/min    Comment: (NOTE) The eGFR has been calculated using the CKD EPI equation. This calculation has not been validated in all clinical situations. eGFR's persistently <60 mL/min signify possible Chronic Kidney Disease.    Anion gap 13 5 - 15    Comment: Performed at [facility] [address] [place], [place] [address]  CBC with Differential     Status: Abnormal   Collection Time: 01/03/18  4:47 AM  Result Value Ref Range   WBC 13.3 (H) 4.0 - 10.5 K/uL   RBC 4.72 4.22 - 5.81 MIL/uL   Hemoglobin 14.6 13.0 - 17.0 g/dL   HCT 42.5 39.0 - 52.0 %   MCV 90.0 78.0 - 100.0 fL   MCH 30.9 26.0 - 34.0 pg   MCHC 34.4 30.0 -  36.0 g/dL   RDW 12.0 11.5 - 15.5 %   Platelets 229 150 - 400 K/uL   Neutrophils Relative % 82 %   Neutro Abs 10.8 (H) 1.7 - 7.7 K/uL   Lymphocytes Relative 13 %   Lymphs Abs 1.7 0.7 - 4.0 K/uL   Monocytes Relative 5 %   Monocytes Absolute 0.7 0.1 - 1.0 K/uL   Eosinophils Relative 0 %   Eosinophils Absolute 0.0 0.0 - 0.7 K/uL   Basophils Relative 0 %   Basophils Absolute 0.0 0.0 - 0.1 K/uL   Immature Granulocytes 0 %   Abs Immature Granulocytes 0.0 0.0 - 0.1 K/uL    Comment: Performed at  [address] [place], [place] [address]   Dg Tibia/fibula Right  Result Date: 01/03/2018 CLINICAL DATA:  Fall EXAM: RIGHT TIBIA AND FIBULA - 2 VIEW COMPARISON:  None. FINDINGS: Acute fracture involving proximal shaft of the fibula at the junction of the proximal and middle thirds with about 1/3 bone with of anterior displacement of distal  fracture fragment. Additional acute mildly displaced distal fibular fracture. Acute comminuted intra-articular proximal tibial fracture with wide separation of fracture fragments and impaction of the lateral femoral condyle into the fracture gap. Acute displaced fracture involving the lateral femoral condyle. Large knee effusion. IMPRESSION: 1. Acute comminuted intra-articular fracture of the proximal tibia with wide separation of fracture fragments 2. Acute mildly displaced fracture involving the lateral femoral condyle 3. Acute mildly displaced fractures involving the proximal shaft of the fibula and the distal shaft and metaphysis of the fibula. Electronically Signed   By: Donavan Foil M.D.   On: 01/03/2018 03:23   Dg Knee Complete 4 Views Right  Result Date: 01/03/2018 CLINICAL DATA:  Fall, twisting injury severe knee pain EXAM: RIGHT KNEE - COMPLETE 4+ VIEW COMPARISON:  None. FINDINGS: Large knee effusion. Acute comminuted fracture involving the proximal tibia with 2.3 cm separation of dominant lateral fracture fragment and impaction of the lateral femoral condyle between the proximal tibial fracture fragments. Acute mildly comminuted appearing and displaced fracture involving the lateral femoral condyle. Acute fracture proximal shaft of fibula with 1/4 bone with of anterior displacement of distal fracture fragment. IMPRESSION: 1. Acute comminuted intra-articular proximal tibial fracture with impaction of the lateral femoral condyle between the dominant proximal tibial fracture fragments. 2.4 cm separation of proximal tibial fracture fragments. Additional mildly comminuted fracture involving the lateral femoral condyle. 2. Acute mildly fracture proximal shaft of the fibula. Electronically Signed   By: Donavan Foil M.D.   On: 01/03/2018 03:20   Dg Foot 2 Views Right  Result Date: 01/03/2018 CLINICAL DATA:  Fall EXAM: RIGHT FOOT - 2 VIEW COMPARISON:  None. FINDINGS: No fracture or malalignment at the  foot. Acute mildly displaced distal fibular fracture. IMPRESSION: No acute osseous abnormality of the foot.  Distal fibular fracture Electronically Signed   By: Donavan Foil M.D.   On: 01/03/2018 03:24    Review of Systems  All other systems reviewed and are negative.   Blood pressure (!) 116/58, pulse 79, temperature 99 F (37.2 C), resp. rate 17, height '5\' 10"'$  (1.778 m), weight 120.2 kg, SpO2 97 %. Physical Exam  Constitutional: He appears well-developed and well-nourished.  HENT:  Head: Normocephalic and atraumatic.  Eyes: Pupils are equal, round, and reactive to light. EOM are normal.  Neck: Normal range of motion. Neck supple.  Cardiovascular: Normal rate and regular rhythm.  Respiratory: Effort normal and breath sounds normal.  GI:  Soft. Bowel sounds are normal.  Musculoskeletal:       Right knee: He exhibits decreased range of motion, swelling and deformity. Tenderness found. Lateral joint line tenderness noted.       Legs: Neurological: He is alert.  Skin: Skin is warm and dry.  Psychiatric: He has a normal mood and affect.     Assessment/Plan Right complex tibial plateau fracture with the possibility of impending compartment syndrome  I went over the x-rays with the patient and his mother at the bedside and talked her about the situation.  They understand that he needs external fixation of the right lower extremity spanning the knee to get it out to length with the possibility also of fasciotomies due to impending compartment syndrome.  Definitive fixation of the tibial plateau would need to be at a later date once the CT scan is obtained for definitive treatment of tibial plateau fracture.  We had a long thorough discussion about what his next day to even a few months will be like.  Informed consent is obtained.  Mcarthur Rossetti, MD 01/03/2018, 7:19 AM

## 2018-01-03 NOTE — Brief Op Note (Signed)
01/03/2018  9:53 AM  PATIENT:  [name]  27 y.o. male  PRE-OPERATIVE DIAGNOSIS:  Right tibial plateau fracture  POST-OPERATIVE DIAGNOSIS:  Right tibial plateau fracture  PROCEDURE:  Procedure(s): EXTERNAL FIXATION LEG SPANNING RIGHT KNEE, POSSIBLE 4 COMPARTMENT SYNDROME (Right)  1) Application of external fixation spanning right knee 2)  4 compartment fasciotomies right leg 3)  Application of wound VAC right lateral fasciotomy incision  SURGEON:  Surgeon(s) and Role:    Kathryne Hitch* Mckinzee Spirito Y, MD - Primary   ANESTHESIA:   general  COUNTS:  YES  TOURNIQUET:  None  DICTATION: .Other Dictation: Dictation Number [id number]  PLAN OF CARE: Admit to inpatient   PATIENT DISPOSITION:  PACU - hemodynamically stable.   Delay start of Pharmacological VTE agent (>24hrs) due to surgical blood loss or risk of bleeding: no

## 2018-01-04 ENCOUNTER — Encounter (HOSPITAL_COMMUNITY): Payer: Self-pay | Admitting: Orthopaedic Surgery

## 2018-01-04 MED ORDER — TAMSULOSIN HCL 0.4 MG PO CAPS
0.4000 mg | ORAL_CAPSULE | Freq: Every day | ORAL | Status: DC
  Administered 2018-01-04 – 2018-01-10 (×6): 0.4 mg via ORAL
  Filled 2018-01-04 (×6): qty 1

## 2018-01-04 NOTE — Evaluation (Signed)
Occupational Therapy Evaluation Patient Details Name: [name] MRN: [id number] DOB: 04/18/1991 Today's Date: 01/04/2018    History of Present Illness Pt is 27 yo male who fell sustaining a twisting type injury to R knee with tibial plateau fracture. Underwent external fixation and fasciotomies of all 4 compartments  followed by wound vac placement to lateral compartment on 01/03/18.    Clinical Impression   Pt is typically independent. Presents with significant R LE pain limiting mobility, but moved remarkably well his first time OOB. Pt requires set up to total assist for ADL and min assist for mobility. Pt likely to progress well and not require post acute OT. A barrier to returning home will be his 6 steps. Will follow.   Follow Up Recommendations  No OT follow up;Supervision/Assistance - 24 hour    Equipment Recommendations  3 in 1 bedside commode;Wheelchair (measurements OT);Wheelchair cushion (measurements OT)    Recommendations for Other Services       Precautions / Restrictions Precautions Precautions: Fall Restrictions Weight Bearing Restrictions: Yes RLE Weight Bearing: Non weight bearing      Mobility Bed Mobility Overal bed mobility: Needs Assistance Bed Mobility: Supine to Sit     Supine to sit: +2 for physical assistance;Mod assist;Min assist     General bed mobility comments: min A to upper body and pt was able to manage trunk with use of bed rails, mod A to RLE as pt came to EOB, vc's for sequencing  Transfers Overall transfer level: Needs assistance Equipment used: Rolling walker (2 wheeled) Transfers: Sit to/from [employer] CorporationStand;Stand Pivot Transfers Sit to Stand: Min assist;+2 physical assistance Stand pivot transfers: Min assist;+2 physical assistance       General transfer comment: min A to RLE to prevent WB'ing, min A to steady trunk. Same assist given as pt used RW to take small hops to chair. Pt does well understanding the concept of RLE NWB and is  attempting to comply with this/    Balance Overall balance assessment: Needs assistance   Sitting balance-Leahy Scale: Fair       Standing balance-Leahy Scale: Poor Standing balance comment: unable to release walker in static standing                           ADL either performed or assessed with clinical judgement   ADL Overall ADL's : Needs assistance/impaired Eating/Feeding: Independent;Sitting   Grooming: Wash/dry hands;Wash/dry face;Oral care;Sitting;Set up   Upper Body Bathing: Set up;Sitting   Lower Body Bathing: +2 for physical assistance;Sit to/from stand;Total assistance   Upper Body Dressing : Set up;Sitting   Lower Body Dressing: Total assistance;+2 for physical assistance;Sit to/from stand   Toilet Transfer: Minimal assistance;+2 for safety/equipment;BSC;Stand-pivot;RW   Toileting- Clothing Manipulation and Hygiene: Maximal assistance;Sit to/from stand               Vision Baseline Vision/History: No visual deficits       Perception     Praxis      Pertinent Vitals/Pain Pain Assessment: 0-10 Pain Score: 8  Pain Location: R LE Pain Descriptors / Indicators: Aching Pain Intervention(s): Monitored during session;Patient requesting pain meds-RN notified;Repositioned;Ice applied     Hand Dominance Right   Extremity/Trunk Assessment Upper Extremity Assessment Upper Extremity Assessment: Overall WFL for tasks assessed   Lower Extremity Assessment Lower Extremity Assessment: Defer to PT evaluation RLE Deficits / Details: sensation in tact to light touch, able to move toes minimally and ankle even less,  tolerated passive ankle df/pf ~20 degrees RLE: Unable to fully assess due to pain;Unable to fully assess due to immobilization RLE Sensation: decreased proprioception RLE Coordination: WNL   Cervical / Trunk Assessment Cervical / Trunk Assessment: Normal   Communication Communication Communication: No difficulties   Cognition  Arousal/Alertness: Awake/alert Behavior During Therapy: WFL for tasks assessed/performed Overall Cognitive Status: Within Functional Limits for tasks assessed                                     General Comments  pt attempted to urinate in standing  but was not successful. pt with bloody drainage all over pillow cases in bed, RN shown    Exercises Exercises: General Lower Extremity General Exercises - Lower Extremity Ankle Circles/Pumps: AROM;Both;10 reps   Shoulder Instructions      Home Living Family/patient expects to be discharged to:: Private residence Living Arrangements: Parent;Other relatives Available Help at Discharge: Family;Available PRN/intermittently Type of Home: House Home Access: Stairs to enter [employer] of Steps: 6 Entrance Stairs-Rails: Right Home Layout: One level     Bathroom Shower/Tub: Tub/shower unit;Walk-in shower   Bathroom Toilet: Standard     Home Equipment: None   Additional Comments: pt lives with mom and sister      Prior Functioning/Environment Level of Independence: Independent        Comments: works out of town in [employer] Problem List: Decreased coordination;Decreased activity tolerance;Impaired balance (sitting and/or standing);Decreased knowledge of use of DME or AE;Pain      OT Treatment/Interventions: Self-care/ADL training;DME and/or AE instruction;Patient/family education;Balance training;Therapeutic activities    OT Goals(Current goals can be found in the care plan section) Acute Rehab OT Goals Patient Stated Goal: return to home and work OT Goal Formulation: With patient Time For Goal Achievement: 01/18/18 Potential to Achieve Goals: Good ADL Goals Pt Will Perform Lower Body Bathing: (P) with min assist;sit to/from stand;with adaptive equipment Pt Will Perform Lower Body Dressing: (P) with min assist;sit to/from stand;with adaptive equipment Pt Will Transfer to Toilet: (P)  with supervision;ambulating;bedside commode(over toilet) Pt Will Perform Toileting - Clothing Manipulation and hygiene: (P) with supervision;sitting/lateral leans;sit to/from stand Additional ADL Goal #1: (P) Pt will perform bed mobility modified independently with leg lifter as needed.  OT Frequency: Min 3X/week   Barriers to D/C:            Co-evaluation PT/OT/SLP Co-Evaluation/Treatment: Yes Reason for Co-Treatment: For patient/therapist safety PT goals addressed during session: Mobility/safety with mobility;Balance;Proper use of DME;Strengthening/ROM OT goals addressed during session: ADL's and self-care      AM-PAC PT "6 Clicks" Daily Activity     Outcome Measure Help from another person eating meals?: None Help from another person taking care of personal grooming?: A Little Help from another person toileting, which includes using toliet, bedpan, or urinal?: Total Help from another person bathing (including washing, rinsing, drying)?: Total Help from another person to put on and taking off regular upper body clothing?: A Lot Help from another person to put on and taking off regular lower body clothing?: A Lot 6 Click Score: 13   End of Session Equipment Utilized During Treatment: Gait belt;Rolling walker Nurse Communication: Patient requests pain meds;Mobility status  Activity Tolerance: Patient tolerated treatment well Patient left: in chair;with call bell/phone within reach;with chair alarm set  OT Visit Diagnosis: Unsteadiness on feet (R26.81);Pain  Time: 8295-6213 OT Time Calculation (min): 33 min Charges:  OT General Charges $OT Visit: 1 Visit OT Evaluation $OT Eval Moderate Complexity: 1 Mod  01/04/2018 Martie Round, OTR/L Pager: [phone]  [name], [place] B[employer] 01/04/2018, 1:27 PM

## 2018-01-04 NOTE — Consult Note (Signed)
Orthopaedic Trauma Service (OTS) Consult   Patient ID: [name] MRN: [id number] DOB/AGE: 05/16/1990 27 y.o.  Reason for Consult: Right tibial plateau fracture with compartment syndrome Referring Physician: Dr. Christopher Blackman, MD [facility]  HPI: [name] is an 27 y.o. male who is being seen in consultation at the request of Dr. Blackman for evaluation of right tibial plateau fracture.  The patient was injured on 01/02/2018 when he was in [place].  He had a mechanical fall and twisting injury to his leg.  He had immediate pain and deformity.  He was driven to [employer] at which point he presented to [facility].  Upon evaluation by Dr. Blackman he felt that his compartments were rather full and he had a signs of developing compartment syndrome as a result he took him for external fixation and 4 compartment fasciotomy release.  I was asked to consult regarding definitive treatment for his tibial plateau fracture.  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  He smokes cigarettes.  He denies any other drug use.  He lives at home with his family.  He lives in a one-story apartment.  He denies any pain anywhere else.  Denies any injuries to his left lower extremity or bilateral upper extremities.  Does note some numbness and tingling in his foot worse on the dorsum versus the plantar aspect of his foot.  Currently complaining of unable to urinate.  History reviewed. No pertinent past medical history.  Past Surgical History:  Procedure Laterality Date  . EXTERNAL FIXATION LEG Right 01/03/2018   Procedure: EXTERNAL FIXATION LEG SPANNING RIGHT KNEE, POSSIBLE 4 COMPARTMENT SYNDROME;  Surgeon: Blackman, Christopher Y, MD;  Location: [facility] OR;  Service: Orthopedics;  Laterality: Right;  . FINGER SURGERY      History reviewed. No pertinent family history.  Social History:  reports that he has been smoking. He has been smoking about 1.00 pack per day. His smokeless tobacco  use includes chew. He reports that he drinks alcohol. He reports that he does not use drugs.  Allergies: No Known Allergies  Medications:  No current facility-administered medications on file prior to encounter.    No current outpatient medications on file prior to encounter.    ROS: Constitutional: No fever or chills Vision: No changes in vision ENT: No difficulty swallowing CV: No chest pain Pulm: No SOB or wheezing GI: No nausea or vomiting GU: No urgency or inability to hold urine Skin: No poor wound healing Neurologic: No numbness or tingling Psychiatric: No depression or anxiety Heme: No bruising Allergic: No reaction to medications or food   Exam: Blood pressure 134/68, pulse 76, temperature 98.4 F (36.9 C), temperature source Oral, resp. rate 16, height 5' 10" (1.778 m), weight 120.2 kg, SpO2 100 %. General: No acute distress Orientation: Awake alert and oriented Mood and Affect: Cooperative Gait: Unable to assess Coordination and balance: Within normal limits  Right lower extremity: Reveals ex-fix that is in place.  Pin sites are clean dry and intact.  Compartments are soft and compressible.  Dressing is in place.  The wound VAC is without any leaks.  It has serosanguineous drainage from the lateral wound.  Patient is able to wiggle his toes.  Limited ankle dorsiflexion plantarflexion secondary to pain.  He does endorse sensation to the plantar aspect of his foot and dorsum of his foot although it is diminished to light touch to the dorsum.  He has a 2+ DP pulse.  Reflexes are within normal limits.    No lymphadenopathy.  Left lower extremity and bilateral upper extremity skin without lesions. No tenderness to palpation. Full painless ROM, full strength in each muscle groups without evidence of instability.   Medical Decision Making: Imaging: X-rays show a displaced lateral Schatzker 2 tibial plateau fracture with significant articular displacement of the lateral  condyle.  There is also comminution of the tibial spines with questionable extension into the medial condyle.  The lateral condyle was significantly widened.  Labs:  Results for orders placed or performed during the hospital encounter of 01/03/18 (from the past 48 hour(s))  Basic metabolic panel     Status: Abnormal   Collection Time: 01/03/18  4:47 AM  Result Value Ref Range   Sodium 138 135 - 145 mmol/L   Potassium 4.4 3.5 - 5.1 mmol/L   Chloride 105 98 - 111 mmol/L   CO2 20 (L) 22 - 32 mmol/L   Glucose, Bld 93 70 - 99 mg/dL   BUN 10 6 - 20 mg/dL   Creatinine, Ser 1.00 0.61 - 1.24 mg/dL   Calcium 9.0 8.9 - 10.3 mg/dL   GFR calc non Af Amer >60 >60 mL/min   GFR calc Af Amer >60 >60 mL/min    Comment: (NOTE) The eGFR has been calculated using the CKD EPI equation. This calculation has not been validated in all clinical situations. eGFR's persistently <60 mL/min signify possible Chronic Kidney Disease.    Anion gap 13 5 - 15    Comment: Performed at [facility], [address] [place], [place] [address]  CBC with Differential     Status: Abnormal   Collection Time: 01/03/18  4:47 AM  Result Value Ref Range   WBC 13.3 (H) 4.0 - 10.5 K/uL   RBC 4.72 4.22 - 5.81 MIL/uL   Hemoglobin 14.6 13.0 - 17.0 g/dL   HCT 42.5 39.0 - 52.0 %   MCV 90.0 78.0 - 100.0 fL   MCH 30.9 26.0 - 34.0 pg   MCHC 34.4 30.0 - 36.0 g/dL   RDW 12.0 11.5 - 15.5 %   Platelets 229 150 - 400 K/uL   Neutrophils Relative % 82 %   Neutro Abs 10.8 (H) 1.7 - 7.7 K/uL   Lymphocytes Relative 13 %   Lymphs Abs 1.7 0.7 - 4.0 K/uL   Monocytes Relative 5 %   Monocytes Absolute 0.7 0.1 - 1.0 K/uL   Eosinophils Relative 0 %   Eosinophils Absolute 0.0 0.0 - 0.7 K/uL   Basophils Relative 0 %   Basophils Absolute 0.0 0.0 - 0.1 K/uL   Immature Granulocytes 0 %   Abs Immature Granulocytes 0.0 0.0 - 0.1 K/uL    Comment: Performed at [facility], [address] [place], [place] [address]    Medical history  and chart was reviewed  Assessment/Plan: 27-year-old male with right displaced tibial plateau fracture with compartment syndrome.  Plan to proceed with irrigation and closure of his fasciotomy wound prior to performing definitive ORIF.  Likely perform this on Wednesday morning.  If closure goes well could potentially perform definitive fixation on Friday.  Risks and benefits were discussed with the patient. Risks discussed included bleeding requiring blood transfusion, bleeding causing a hematoma, infection, malunion, nonunion, damage to surrounding nerves and blood vessels, pain, hardware prominence or irritation, hardware failure, stiffness, post-traumatic arthritis, DVT/PE, compartment syndrome, and even death.  Patient agrees and consents to surgery.   Hiroyuki Ozanich P. Alexandrya Chim, MD [facility] [phone] (phone)   

## 2018-01-04 NOTE — Evaluation (Signed)
Physical Therapy Evaluation Patient Details Name: [name] MRN: [id number] DOB: 1991/05/02 Today's Date: 01/04/2018   History of Present Illness  Pt is 27 yo male who fell sustaining a twisting type injury to R knee with tibial plateau fracture. Underwent external fixation and fasciotomies of all 4 compartments  followed by wound vac placement to lateral compartment on 01/03/18.   Clinical Impression  Pt admitted with above diagnosis. Pt currently with functional limitations due to the deficits listed below (see PT Problem List). Pt transferred bed to chair with use of RW and +2 min A. Pt will need w/c as well as RW for home. He lives in a 1 level home but has 6 STE which will be a challenge.  Pt will benefit from skilled PT to increase their independence and safety with mobility to allow discharge to the venue listed below.       Follow Up Recommendations Outpatient PT when appropriate    Equipment Recommendations  Rolling walker with 5" wheels;3in1 (PT);Wheelchair (measurements PT)    Recommendations for Other Services       Precautions / Restrictions Precautions Precautions: Fall Restrictions Weight Bearing Restrictions: Yes RLE Weight Bearing: Non weight bearing      Mobility  Bed Mobility Overal bed mobility: Needs Assistance Bed Mobility: Supine to Sit     Supine to sit: +2 for physical assistance;Mod assist;Min assist     General bed mobility comments: min A to upper body and pt was able to manage trunk with use of bed rails, mod A to RLE as pt came to EOB, vc's for sequencing  Transfers Overall transfer level: Needs assistance Equipment used: Rolling walker (2 wheeled) Transfers: Sit to/from [employer] Sit to Stand: Min assist;+2 physical assistance Stand pivot transfers: Min assist;+2 physical assistance       General transfer comment: min A to RLE to prevent WB'ing, min A to steady trunk. Same assist given as pt used RW to take small  hops to chair. Pt does well understanding the concept of RLE NWB and is attempting to comply with this/  Ambulation/Gait             General Gait Details: only able to pivot to chair today due to pain  Stairs            Wheelchair Mobility    Modified Rankin (Stroke Patients Only)       Balance Overall balance assessment: No apparent balance deficits (not formally assessed)                                           Pertinent Vitals/Pain Pain Assessment: 0-10 Pain Score: 8  Pain Location: R LE Pain Descriptors / Indicators: Aching Pain Intervention(s): Limited activity within patient's tolerance;Monitored during session;Premedicated before session    Home Living Family/patient expects to be discharged to:: Private residence Living Arrangements: Parent;Other relatives Available Help at Discharge: Family;Available PRN/intermittently Type of Home: House Home Access: Stairs to enter Entrance Stairs-Rails: Right Entrance Stairs-Number of Steps: 6 Home Layout: One level Home Equipment: None Additional Comments: pt lives with mom and sister    Prior Function Level of Independence: Independent         Comments: works out of town in [employer]/rest[employer]   Dominant Hand: Right    Extremity/Trunk Assessment   Upper Extremity Assessment Upper Extremity Assessment:  Defer to OT evaluation    Lower Extremity Assessment Lower Extremity Assessment: RLE deficits/detail RLE Deficits / Details: sensation in tact to light touch, able to move toes minimally and ankle even less, tolerated passive ankle df/pf ~20 degrees RLE: Unable to fully assess due to pain;Unable to fully assess due to immobilization RLE Sensation: decreased proprioception RLE Coordination: WNL    Cervical / Trunk Assessment Cervical / Trunk Assessment: Normal  Communication   Communication: No difficulties  Cognition Arousal/Alertness: Awake/alert Behavior  During Therapy: WFL for tasks assessed/performed Overall Cognitive Status: Within Functional Limits for tasks assessed                                        General Comments General comments (skin integrity, edema, etc.): pt attempted to urinate in standing  but was not successful. pt with bloody drainage all over pillow cases in bed, RN shown    Exercises General Exercises - Lower Extremity Ankle Circles/Pumps: AROM;Both;10 reps   Assessment/Plan    PT Assessment Patient needs continued PT services  PT Problem List Decreased activity tolerance;Decreased range of motion;Decreased mobility;Decreased knowledge of use of DME;Decreased knowledge of precautions;Pain;Obesity       PT Treatment Interventions DME instruction;Gait training;Stair training;Functional mobility training;Therapeutic activities;Therapeutic exercise;Patient/family education    PT Goals (Current goals can be found in the Care Plan section)  Acute Rehab PT Goals Patient Stated Goal: return to home and work PT Goal Formulation: With patient Time For Goal Achievement: 01/18/18 Potential to Achieve Goals: Good    Frequency Min 5X/week   Barriers to discharge Inaccessible home environment 6 STE home    Co-evaluation PT/OT/SLP Co-Evaluation/Treatment: Yes Reason for Co-Treatment: For patient/therapist safety PT goals addressed during session: Mobility/safety with mobility;Balance;Proper use of DME;Strengthening/ROM OT goals addressed during session: ADL's and self-care       AM-PAC PT "6 Clicks" Daily Activity  Outcome Measure Difficulty turning over in bed (including adjusting bedclothes, sheets and blankets)?: A Little Difficulty moving from lying on back to sitting on the side of the bed? : A Little Difficulty sitting down on and standing up from a chair with arms (e.g., wheelchair, bedside commode, etc,.)?: Unable Help needed moving to and from a bed to chair (including a wheelchair)?: A  Lot Help needed walking in hospital room?: Total Help needed climbing 3-5 steps with a railing? : Total 6 Click Score: 11    End of Session   Activity Tolerance: Patient tolerated treatment well Patient left: in chair;with call bell/phone within reach Nurse Communication: Mobility status PT Visit Diagnosis: Pain;Difficulty in walking, not elsewhere classified (R26.2) Pain - Right/Left: Right Pain - part of body: Leg    Time: 1020-1052 PT Time Calculation (min) (ACUTE ONLY): 32 min   Charges:   PT Evaluation $PT Eval Moderate Complexity: 1 Mod          [address]ia Malacai Grantz, PT  Acute Rehab Services  [phone]   PollockVictoria L Catarina Huntley 01/04/2018, 11:45 AM

## 2018-01-04 NOTE — Progress Notes (Signed)
Pt has been straining to urinate with foley in. Output from foley since beginning of shift was 600 ml. Bladder scanned pt and 21 ml of urine. Pt temp 100.6 tylenol given. Called on call MD.

## 2018-01-04 NOTE — Progress Notes (Signed)
Subjective: 1 Day Post-Op Procedure(s) (LRB): EXTERNAL FIXATION LEG SPANNING RIGHT KNEE, POSSIBLE 4 COMPARTMENT SYNDROME (Right) Patient reports pain as moderate. Reports right foot numbness.  Objective: Vital signs in last 24 hours: Temp:  [97 F (36.1 C)-98.6 F (37 C)] 98.4 F (36.9 C) (08/26 0451) Pulse Rate:  [67-93] 76 (08/26 0451) Resp:  [15-20] 16 (08/26 0451) BP: (120-150)/(56-80) 134/68 (08/26 0451) SpO2:  [90 %-100 %] 100 % (08/26 0451)  Intake/Output from previous day: 08/25 0701 - 08/26 0700 In: 2499.4 [P.O.:480; I.V.:1926.3; IV Piggyback:93.1] Out: 2300 [Urine:2000; Drains:150; Blood:150] Intake/Output this shift: No intake/output data recorded.  Recent Labs    01/03/18 0447  HGB 14.6   Recent Labs    01/03/18 0447  WBC 13.3*  RBC 4.72  HCT 42.5  PLT 229   Recent Labs    01/03/18 0447  NA 138  K 4.4  CL 105  CO2 20*  BUN 10  CREATININE 1.00  GLUCOSE 93  CALCIUM 9.0   No results for input(s): LABPT, INR in the last 72 hours.  Intact pulses distally His right foot is swollen with decreased sensation He will slightly extend his right great toes only   Assessment/Plan: 1 Day Post-Op Procedure(s) (LRB): EXTERNAL FIXATION LEG SPANNING RIGHT KNEE, POSSIBLE 4 COMPARTMENT SYNDROME (Right) Up with therapy - NWB right LE Will need consult with Orthopedic Traumatologist for their expertise with treating such complex tibial plateau fractures. He does have a VAC sponge over his lateral fasciotomy wound. Start Lovenox.    Cory HitchChristopher Y Caysen Wilcox 01/04/2018, 7:53 AM

## 2018-01-04 NOTE — Progress Notes (Signed)
RN called to pt's room because pt stated he felt like he needed to void but could not. Bladder scan showed . Pt asked if in and out cath could be done again and RN informed pt that it had not been 6 hours since am cath at approximately 0845, but would inform MD of situation. Office for Haddix, MD called and stated to "full cath" pt. Charge RN called office back to clarify. Foley placed per Haddix, MD. Pt stated relief. Will continue to monitor.

## 2018-01-04 NOTE — H&P (View-Only) (Signed)
Orthopaedic Trauma Service (OTS) Consult   Patient ID: [name] MRN: [id number] DOB/AGE: August 05, 1990 27 y.o.  Reason for Consult: Right tibial plateau fracture with compartment syndrome Referring Physician: Dr. Jean Rosenthal, MD [facility]  HPI: [name] is an 27 y.o. male who is being seen in consultation at the request of Dr. Ninfa Linden for evaluation of right tibial plateau fracture.  The patient was injured on 01/02/2018 when he was in [place].  He had a mechanical fall and twisting injury to his leg.  He had immediate pain and deformity.  He was driven to [facility] at which point he presented to [facility].  Upon evaluation by Dr. Ninfa Linden he felt that his compartments were rather full and he had a signs of developing compartment syndrome as a result he took him for external fixation and 4 compartment fasciotomy release.  I was asked to consult regarding definitive treatment for his tibial plateau fracture.  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  He smokes cigarettes.  He denies any other drug use.  He lives at home with his family.  He lives in a [place] apartment.  He denies any pain anywhere else.  Denies any injuries to his left lower extremity or bilateral upper extremities.  Does note some numbness and tingling in his foot worse on the dorsum versus the plantar aspect of his foot.  Currently complaining of unable to urinate.  History reviewed. No pertinent past medical history.  Past Surgical History:  Procedure Laterality Date  . EXTERNAL FIXATION LEG Right 01/03/2018   Procedure: EXTERNAL FIXATION LEG SPANNING RIGHT KNEE, POSSIBLE 4 COMPARTMENT SYNDROME;  Surgeon: Mcarthur Rossetti, MD;  Location: [facility];  Service: Orthopedics;  Laterality: Right;  . FINGER SURGERY      History reviewed. No pertinent family history.  Social History:  reports that he has been smoking. He has been smoking about 1.00 pack per day. His smokeless tobacco  use includes chew. He reports that he drinks alcohol. He reports that he does not use drugs.  Allergies: No Known Allergies  Medications:  No current facility-administered medications on file prior to encounter.    No current outpatient medications on file prior to encounter.    ROS: Constitutional: No fever or chills Vision: No changes in vision ENT: No difficulty swallowing CV: No chest pain Pulm: No SOB or wheezing GI: No nausea or vomiting GU: No urgency or inability to hold urine Skin: No poor wound healing Neurologic: No numbness or tingling Psychiatric: No depression or anxiety Heme: No bruising Allergic: No reaction to medications or food   Exam: Blood pressure 134/68, pulse 76, temperature 98.4 F (36.9 C), temperature source Oral, resp. rate 16, height _0  (1.778 m), weight 120.2 kg, SpO2 100 %. General: No acute distress Orientation: Awake alert and oriented Mood and Affect: Cooperative Gait: Unable to assess Coordination and balance: Within normal limits  Right lower extremity: Reveals ex-fix that is in place.  Pin sites are clean dry and intact.  Compartments are soft and compressible.  Dressing is in place.  The wound VAC is without any leaks.  It has serosanguineous drainage from the lateral wound.  Patient is able to wiggle his toes.  Limited ankle dorsiflexion plantarflexion secondary to pain.  He does endorse sensation to the plantar aspect of his foot and dorsum of his foot although it is diminished to light touch to the dorsum.  He has a 2+ DP pulse.  Reflexes are within normal limits.  No lymphadenopathy.  Left lower extremity and bilateral upper extremity skin without lesions. No tenderness to palpation. Full painless ROM, full strength in each muscle groups without evidence of instability.   Medical Decision Making: Imaging: X-rays show a displaced lateral Schatzker 2 tibial plateau fracture with significant articular displacement of the lateral  condyle.  There is also comminution of the tibial spines with questionable extension into the medial condyle.  The lateral condyle was significantly widened.  Labs:  Results for orders placed or performed during the hospital encounter of 01/03/18 (from the past 48 hour(s))  Basic metabolic panel     Status: Abnormal   Collection Time: 01/03/18  4:47 AM  Result Value Ref Range   Sodium 138 135 - 145 mmol/L   Potassium 4.4 3.5 - 5.1 mmol/L   Chloride 105 98 - 111 mmol/L   CO2 20 (L) 22 - 32 mmol/L   Glucose, Bld 93 70 - 99 mg/dL   BUN 10 6 - 20 mg/dL   Creatinine, Ser 1.00 0.61 - 1.24 mg/dL   Calcium 9.0 8.9 - 10.3 mg/dL   GFR calc non Af Amer >60 >60 mL/min   GFR calc Af Amer >60 >60 mL/min    Comment: (NOTE) The eGFR has been calculated using the CKD EPI equation. This calculation has not been validated in all clinical situations. eGFR's persistently <60 mL/min signify possible Chronic Kidney Disease.    Anion gap 13 5 - 15    Comment: Performed at [facility] [address], [place], [place] [address]  CBC with Differential     Status: Abnormal   Collection Time: 01/03/18  4:47 AM  Result Value Ref Range   WBC 13.3 (H) 4.0 - 10.5 K/uL   RBC 4.72 4.22 - 5.81 MIL/uL   Hemoglobin 14.6 13.0 - 17.0 g/dL   HCT 42.5 39.0 - 52.0 %   MCV 90.0 78.0 - 100.0 fL   MCH 30.9 26.0 - 34.0 pg   MCHC 34.4 30.0 - 36.0 g/dL   RDW 12.0 11.5 - 15.5 %   Platelets 229 150 - 400 K/uL   Neutrophils Relative % 82 %   Neutro Abs 10.8 (H) 1.7 - 7.7 K/uL   Lymphocytes Relative 13 %   Lymphs Abs 1.7 0.7 - 4.0 K/uL   Monocytes Relative 5 %   Monocytes Absolute 0.7 0.1 - 1.0 K/uL   Eosinophils Relative 0 %   Eosinophils Absolute 0.0 0.0 - 0.7 K/uL   Basophils Relative 0 %   Basophils Absolute 0.0 0.0 - 0.1 K/uL   Immature Granulocytes 0 %   Abs Immature Granulocytes 0.0 0.0 - 0.1 K/uL    Comment: Performed at [facility] [address] [place], [place] [address]    Medical history  and chart was reviewed  Assessment/Plan: 27 year old male with right displaced tibial plateau fracture with compartment syndrome.  Plan to proceed with irrigation and closure of his fasciotomy wound prior to performing definitive ORIF.  Likely perform this on Wednesday morning.  If closure goes well could potentially perform definitive fixation on Friday.  Risks and benefits were discussed with the patient. Risks discussed included bleeding requiring blood transfusion, bleeding causing a hematoma, infection, malunion, nonunion, damage to surrounding nerves and blood vessels, pain, hardware prominence or irritation, hardware failure, stiffness, post-traumatic arthritis, DVT/PE, compartment syndrome, and even death.  Patient agrees and consents to surgery.   Shona Needles, MD [facility] [phone] (phone)

## 2018-01-04 NOTE — Progress Notes (Addendum)
PT Note for wheelchair justification:  Patient suffers from R tibial plateau fracture which impairs their ability to perform daily activities like walking in the home.  A walker alone will not resolve the issues with performing activities of daily living. A wheelchair with a right elevating leg rest will allow patient to safely perform daily activities.  The patient can self propel in the home or has a caregiver who can provide assistance.     [name], PT  Acute Rehab Services  [phone]

## 2018-01-05 MED ORDER — DEXTROSE 5 % IV SOLN
3.0000 g | INTRAVENOUS | Status: AC
  Administered 2018-01-06: 3 g via INTRAVENOUS
  Filled 2018-01-05: qty 3

## 2018-01-05 MED ORDER — CHLORHEXIDINE GLUCONATE 4 % EX LIQD
60.0000 mL | Freq: Once | CUTANEOUS | Status: AC
  Administered 2018-01-05: 4 via TOPICAL

## 2018-01-05 MED ORDER — POVIDONE-IODINE 10 % EX SWAB: 2.0000 "application " | Freq: Once | CUTANEOUS | Status: DC

## 2018-01-05 MED ORDER — KETOROLAC TROMETHAMINE 15 MG/ML IJ SOLN
15.0000 mg | Freq: Four times a day (QID) | INTRAMUSCULAR | Status: AC
  Administered 2018-01-05 – 2018-01-09 (×18): 15 mg via INTRAVENOUS
  Filled 2018-01-05 (×18): qty 1

## 2018-01-05 NOTE — Progress Notes (Signed)
Physical Therapy Treatment Patient Details Name: [name] MRN: [id number] DOB: [birth year] Today's Date: 01/05/2018    History of Present Illness Pt is 27 yo male who fell sustaining a twisting type injury to R knee with tibial plateau fracture. Underwent external fixation and fasciotomies of all 4 compartments  followed by wound vac placement to lateral compartment on 01/03/18.     PT Comments    Pt progressing with mobility, was able to ambulate 6' with RW and min A +2 for safety. Pt was able to keep RLE NWB. Continues to c/o tightness R lower leg and drainage noted on bedding but not as much as yesterday. PT will continue to follow.    Follow Up Recommendations  Outpatient PT     Equipment Recommendations  Rolling walker with 5" wheels;3in1 (PT);Wheelchair (measurements PT)    Recommendations for Other Services       Precautions / Restrictions Precautions Precautions: Fall Restrictions Weight Bearing Restrictions: Yes RLE Weight Bearing: Non weight bearing    Mobility  Bed Mobility Overal bed mobility: Needs Assistance Bed Mobility: Supine to Sit     Supine to sit: Min assist     General bed mobility comments: RLE supported with pad as pt scooted self to EOB  Transfers Overall transfer level: Needs assistance Equipment used: Rolling walker (2 wheeled) Transfers: Sit to/from Stand Sit to Stand: Min assist;+2 safety/equipment         General transfer comment: min A to insure RLE NWB but pt was able to do this on his own today and +2 for safety and to hold RW stable as pt pushed on one side  Ambulation/Gait Ambulation/Gait assistance: Min assist;+2 safety/equipment Gait Distance (Feet): 6 Feet Assistive device: Rolling walker (2 wheeled) Gait Pattern/deviations: Step-to pattern Gait velocity: decreased Gait velocity interpretation: <1.31 ft/sec, indicative of household ambulator General Gait Details: pt able to hop on LLE with min A to steady, +2 for  chair behind   Stairs             Wheelchair Mobility    Modified Rankin (Stroke Patients Only)       Balance Overall balance assessment: No apparent balance deficits (not formally assessed) Sitting-balance support: No upper extremity supported Sitting balance-Leahy Scale: Normal         Standing balance comment: steady with use of RW to maintain NWB                            Cognition Arousal/Alertness: Awake/alert Behavior During Therapy: WFL for tasks assessed/performed Overall Cognitive Status: Within Functional Limits for tasks assessed                                        Exercises General Exercises - Lower Extremity Ankle Circles/Pumps: AROM;Both;10 reps Other Exercises Other Exercises: passive stretch R ankle dorsiflexion and taughter pt to do this with sheet 30 sec every hour    General Comments        Pertinent Vitals/Pain Pain Assessment: 0-10 Pain Score: 8  Pain Location: R LE Pain Descriptors / Indicators: Aching Pain Intervention(s): Limited activity within patient's tolerance;Monitored during session;Premedicated before session    Home Living                      Prior Function  PT Goals (current goals can now be found in the care plan section) Acute Rehab PT Goals Patient Stated Goal: return to home and work PT Goal Formulation: With patient Time For Goal Achievement: 01/18/18 Potential to Achieve Goals: Good Progress towards PT goals: Progressing toward goals    Frequency    Min 5X/week      PT Plan Current plan remains appropriate    Co-evaluation              AM-PAC PT "6 Clicks" Daily Activity  Outcome Measure  Difficulty turning over in bed (including adjusting bedclothes, sheets and blankets)?: A Little Difficulty moving from lying on back to sitting on the side of the bed? : A Little Difficulty sitting down on and standing up from a chair with arms (e.g.,  wheelchair, bedside commode, etc,.)?: A Little Help needed moving to and from a bed to chair (including a wheelchair)?: A Little Help needed walking in hospital room?: A Little Help needed climbing 3-5 steps with a railing? : Total 6 Click Score: 16    End of Session Equipment Utilized During Treatment: Gait belt Activity Tolerance: Patient tolerated treatment well Patient left: in chair;with call bell/phone within reach Nurse Communication: Mobility status PT Visit Diagnosis: Pain;Difficulty in walking, not elsewhere classified (R26.2) Pain - Right/Left: Right Pain - part of body: Leg     Time: 1041-1102 PT Time Calculation (min) (ACUTE ONLY): 21 min  Charges:  $Gait Training: 8-22 mins                     Lyanne Co, PT  Acute Rehab Services  [phone]    [name] [name] 01/05/2018, 11:37 AM

## 2018-01-05 NOTE — Progress Notes (Signed)
Patient ID: [name], male   DOB: Jul 17, 1990, 27 y.o.   MRN: [id number] No acute changes.  Vitals stable.  Appreciate  Dr. Luvenia StarchHaddix's consult on this complex orthopedic patient.  Likely OR tomorrow.

## 2018-01-05 NOTE — Progress Notes (Signed)
Occupational Therapy Treatment/Splint Check Notes  Upon arrival, pt supine in bed with footplate off. Pt reporting he doffed footplate ~20 minutes earlier with his mother's assistance. No redness, pressure spots, or irritation noted. Placing rolled towels on lateral side of RLE to prevent external rotation of hip. Pt able to stating when footplate should be donned (at 6 PM) tonight with Min VCs. Will continue to monitor acutely. RN notified to check and re-don footplate at [facility]6PM. Thank you.    Marlise Fahr MSOT, OTR/L Acute Rehab Pager: [phone] Office: [phone]  Time: [id number] OT Time Calculation (min): 13 min  Charges: OT General Charges $OT Visit: 1 Visit OT Treatments $Orthotics/Prosthetics Check:8-22 mins

## 2018-01-05 NOTE — Progress Notes (Addendum)
Occupational Therapy Treatment Patient Details Name: [name] MRN: [id number] DOB: 20-Feb-1991 Today's Date: 01/05/2018    History of present illness Pt is 27 yo male who fell sustaining a twisting type injury to R knee with tibial plateau fracture. Underwent external fixation and fasciotomies of all 4 compartments  followed by wound vac placement to lateral compartment on 01/03/18.    OT comments  RN STAFF  Please check splint every 2 hours during shift ( remove splint ) to assess for: * pain * redness *swelling *skin break down  If any symptoms above are present remove splint for 15 minutes. If symptoms continue - keep the splint removed and notify OT staff [phone] immediately.   Keep the splinted Lower extremity elevated at all times on pillows / towels.  Splint can be cleaned by wiping with warm soapy water (do not submerge in water). Splint should not come in contact with any type of heat because the splint will mold into a new shape.   Splints are to be worn for 2 hours and off for 1 hours  Wear schedule example: OFF : 6 AM  ON: 7 AM OFF : 9 PM ON : 10 PM OFF :12 PM  THANK YOU OCCUPATIONAL THERAPY [phone]      Follow Up Recommendations  No OT follow up;Supervision/Assistance - 24 hour    Equipment Recommendations  3 in 1 bedside commode;Wheelchair (measurements OT);Wheelchair cushion (measurements OT)    Recommendations for Other Services      Precautions / Restrictions Precautions Precautions: Fall Restrictions Weight Bearing Restrictions: Yes RLE Weight Bearing: Non weight bearing       Mobility Bed Mobility               General bed mobility comments: Pt able to pull himself up into log sitting in bed  Transfers                      Balance                                           ADL either performed or assessed with clinical judgement   ADL                                               Vision       Perception     Praxis      Cognition Arousal/Alertness: Awake/alert Behavior During Therapy: WFL for tasks assessed/performed Overall Cognitive Status: Within Functional Limits for tasks assessed                                 General Comments: Recently had pain mediaction and required increased cues        Exercises     Shoulder Instructions       General Comments Fabicated foot plate splint for RLE. Educating pt on wear schedule and management. Mother present at end of session for further education on foot plate management. Notified RN and NT    Pertinent Vitals/ Pain       Pain Assessment: Faces Faces Pain Scale: Hurts little more Pain Location: R LE Pain Descriptors / Indicators: Aching;Discomfort Pain Intervention(s):  Monitored during session;Repositioned  Home Living                                          Prior Functioning/Environment              Frequency  Min 3X/week        Progress Toward Goals  OT Goals(current goals can now be found in the care plan section)  Progress towards OT goals: Progressing toward goals  Acute Rehab OT Goals Patient Stated Goal: return to home and work OT Goal Formulation: With patient Time For Goal Achievement: 01/18/18 Potential to Achieve Goals: Good ADL Goals Pt Will Perform Lower Body Bathing: with min assist;sit to/from stand;with adaptive equipment Pt Will Perform Lower Body Dressing: with min assist;sit to/from stand;with adaptive equipment Pt Will Transfer to Toilet: with supervision;ambulating;bedside commode(Over toilet) Pt Will Perform Toileting - Clothing Manipulation and hygiene: with supervision;sitting/lateral leans;sit to/from stand Additional ADL Goal #1: Pt will perform bed mobility modified independently with leg lifter as needed.  Plan Discharge plan remains appropriate    Co-evaluation                 AM-PAC PT "6 Clicks"  Daily Activity     Outcome Measure   Help from another person eating meals?: None Help from another person taking care of personal grooming?: A Little Help from another person toileting, which includes using toliet, bedpan, or urinal?: Total Help from another person bathing (including washing, rinsing, drying)?: Total Help from another person to put on and taking off regular upper body clothing?: A Lot Help from another person to put on and taking off regular lower body clothing?: A Lot 6 Click Score: 13    End of Session    OT Visit Diagnosis: Unsteadiness on feet (R26.81);Pain   Activity Tolerance     Patient Left     Nurse Communication          Time: 1342-1450 OT Time Calculation (min): 68 min  Charges: OT General Charges $OT Visit: 1 Visit OT Treatments $Therapeutic Activity: 8-22 mins $Orthotics Fit/Training: 38-52 mins $ Splint materials basic: 1 Supply  [name] MSOT, OTR/L Acute Rehab Pager: [phone] Office: [phone]   Theodoro Grist Ashe Graybeal 01/05/2018, 4:10 PM

## 2018-01-05 NOTE — Progress Notes (Addendum)
On call MD for pt called at about 0540 stating pt could take flomax early for urinary urgency and straining and that the MD taking care of pt would address the drainage/ bleeding from pt's incision. Pt's temp has also been trending down. Pt also has +2 edema. Ice applied throughout the night. LLE elevated. Will continue to monitor pt.

## 2018-01-05 NOTE — Progress Notes (Signed)
Orthopaedic Trauma Progress Note  S: Having pain. Foley catheter placed yesterday due to retention. Leg feels tight. Denies shortness of breath or chest pain  O:  Vitals:   01/05/18 0100 01/05/18 0355  BP:  137/64  Pulse:  77  Resp:  18  Temp: 100.2 F (37.9 C) 99.9 F (37.7 C)  SpO2:  98%   RLE: Ex-fix in place. Dressing clean and dry. Leg swollen. Wiggles toes, pain with motion of ankle. Warm and well perfused. Diminished sensation to dorsum of foot.  Imaging: No new imaging  Labs: No results found for this or any previous visit (from the past 24 hour(s)).  Assessment: 27 year old male s/p fall  Injuries: Right bicondylar tibial plateau with compartment syndrome s/p ex-fix and compartment release  Plan to return to operating room tomorrow 8/28 for irrigation and closure of fasciotomy wounds  Plan for OT to fabricate foot plate to prevent equinus contracture.   Weightbearing: NWB RLE  Insicional and dressing care: Wound vac and ACE wrap  Orthopedic device(s):None  CV/Blood loss: Hemodynamically stable, will obtain CBC/BMP tomorrow AM prior to surgery  Pain management: 1. Gabapentin 300 mg TID 2. Dilaudid 1-2 mg q 2hours PRN pain 3. Robaxin 500 mg q 6hours PRN spasms 4. Oxycodone 5-15 mg q 4 hours PRN pain 5. Will start toradol 15 mg q 6hr scheduled this AM  VTE prophylaxis: Lovenox 40 mg daily  ID: None needed  Foley/Lines: Foley catheter for urinary retention, continue flomax. Continue with catheter until after surgery tomorrow  Medical co-morbidities: None  Impediments to Fracture Healing: Tobacco use, advised about quitting for bone healing  Dispo: TBD  Follow - up plan: TBD   Roby LoftsKevin P. Haddix, MD [facility] [phone] (phone)

## 2018-01-05 NOTE — Plan of Care (Signed)
  Problem: Education: Goal: Knowledge of General Education information will improve Description: Including pain rating scale, medication(s)/side effects and non-pharmacologic comfort measures Outcome: Progressing   Problem: Clinical Measurements: Goal: Respiratory complications will improve Outcome: Progressing Goal: Cardiovascular complication will be avoided Outcome: Progressing   Problem: Safety: Goal: Ability to remain free from injury will improve Outcome: Progressing   

## 2018-01-06 ENCOUNTER — Inpatient Hospital Stay (HOSPITAL_COMMUNITY): Payer: Self-pay

## 2018-01-06 ENCOUNTER — Encounter (HOSPITAL_COMMUNITY)
Admission: EM | Disposition: A | Discharge status: Home Health | Payer: Self-pay | Source: Home / Self Care | Attending: Student

## 2018-01-06 ENCOUNTER — Inpatient Hospital Stay (HOSPITAL_COMMUNITY): Payer: Self-pay | Admitting: Anesthesiology

## 2018-01-06 ENCOUNTER — Encounter (HOSPITAL_COMMUNITY): Payer: Self-pay | Admitting: Certified Registered"

## 2018-01-06 HISTORY — PX: SECONDARY CLOSURE OF WOUND: SHX6208

## 2018-01-06 LAB — CBC
HCT: 33.6 % — ABNORMAL LOW (ref 39.0–52.0)
Hemoglobin: 11.3 g/dL — ABNORMAL LOW (ref 13.0–17.0)
MCH: 30.6 pg (ref 26.0–34.0)
MCHC: 33.6 g/dL (ref 30.0–36.0)
MCV: 91.1 fL (ref 78.0–100.0)
Platelets: 173 10*3/uL (ref 150–400)
RBC: 3.69 MIL/uL — AB (ref 4.22–5.81)
RDW: 11.9 % (ref 11.5–15.5)
WBC: 7.2 10*3/uL (ref 4.0–10.5)

## 2018-01-06 LAB — BASIC METABOLIC PANEL
Anion gap: 10 (ref 5–15)
BUN: 10 mg/dL (ref 6–20)
CHLORIDE: 103 mmol/L (ref 98–111)
CO2: 25 mmol/L (ref 22–32)
CREATININE: 0.82 mg/dL (ref 0.61–1.24)
Calcium: 8.5 mg/dL — ABNORMAL LOW (ref 8.9–10.3)
GFR calc Af Amer: >60 mL/min (ref >60)
GLUCOSE: 106 mg/dL — AB (ref 70–99)
POTASSIUM: 4 mmol/L (ref 3.5–5.1)
Sodium: 138 mmol/L (ref 135–145)

## 2018-01-06 LAB — SURGICAL PCR SCREEN
MRSA, PCR: NEGATIVE
Staphylococcus aureus: NEGATIVE

## 2018-01-06 SURGERY — SECONDARY CLOSURE OF WOUND
Anesthesia: General | Site: Leg Lower | Laterality: Right

## 2018-01-06 MED ORDER — VANCOMYCIN HCL 1000 MG IV SOLR
INTRAVENOUS | Status: DC | PRN
  Administered 2018-01-06: 1000 mg via TOPICAL

## 2018-01-06 MED ORDER — ONDANSETRON HCL 4 MG/2ML IJ SOLN
INTRAMUSCULAR | Status: AC
  Filled 2018-01-06: qty 2

## 2018-01-06 MED ORDER — MIDAZOLAM HCL 2 MG/2ML IJ SOLN
INTRAMUSCULAR | Status: AC
  Filled 2018-01-06: qty 2

## 2018-01-06 MED ORDER — VANCOMYCIN HCL 1000 MG IV SOLR
INTRAVENOUS | Status: AC
  Filled 2018-01-06: qty 1000

## 2018-01-06 MED ORDER — FENTANYL CITRATE (PF) 250 MCG/5ML IJ SOLN
INTRAMUSCULAR | Status: AC
  Filled 2018-01-06: qty 5

## 2018-01-06 MED ORDER — DEXAMETHASONE SODIUM PHOSPHATE 10 MG/ML IJ SOLN
INTRAMUSCULAR | Status: DC | PRN
  Administered 2018-01-06: 5 mg via INTRAVENOUS

## 2018-01-06 MED ORDER — HYDROMORPHONE HCL 1 MG/ML IJ SOLN
INTRAMUSCULAR | Status: AC
  Filled 2018-01-06: qty 1

## 2018-01-06 MED ORDER — MEPERIDINE HCL 50 MG/ML IJ SOLN: 6.2500 mg | INTRAMUSCULAR | Status: DC | PRN

## 2018-01-06 MED ORDER — ROCURONIUM BROMIDE 50 MG/5ML IV SOSY
PREFILLED_SYRINGE | INTRAVENOUS | Status: AC
  Filled 2018-01-06: qty 5

## 2018-01-06 MED ORDER — LIDOCAINE 2% (20 MG/ML) 5 ML SYRINGE
INTRAMUSCULAR | Status: DC | PRN
  Administered 2018-01-06: 100 mg via INTRAVENOUS

## 2018-01-06 MED ORDER — PROMETHAZINE HCL 25 MG/ML IJ SOLN: 6.2500 mg | INTRAMUSCULAR | Status: DC | PRN

## 2018-01-06 MED ORDER — HYDROCODONE-ACETAMINOPHEN 7.5-325 MG PO TABS
1.0000 | ORAL_TABLET | Freq: Once | ORAL | Status: AC | PRN
  Administered 2018-01-06: 1 via ORAL

## 2018-01-06 MED ORDER — DEXAMETHASONE SODIUM PHOSPHATE 10 MG/ML IJ SOLN
INTRAMUSCULAR | Status: AC
  Filled 2018-01-06: qty 1

## 2018-01-06 MED ORDER — LIDOCAINE 2% (20 MG/ML) 5 ML SYRINGE
INTRAMUSCULAR | Status: AC
  Filled 2018-01-06: qty 5

## 2018-01-06 MED ORDER — PROPOFOL 10 MG/ML IV BOLUS
INTRAVENOUS | Status: AC
  Filled 2018-01-06: qty 20

## 2018-01-06 MED ORDER — ROCURONIUM BROMIDE 10 MG/ML (PF) SYRINGE
PREFILLED_SYRINGE | INTRAVENOUS | Status: DC | PRN
  Administered 2018-01-06: 50 mg via INTRAVENOUS

## 2018-01-06 MED ORDER — FENTANYL CITRATE (PF) 100 MCG/2ML IJ SOLN
INTRAMUSCULAR | Status: DC | PRN
  Administered 2018-01-06: 150 ug via INTRAVENOUS

## 2018-01-06 MED ORDER — HYDROCODONE-ACETAMINOPHEN 7.5-325 MG PO TABS
ORAL_TABLET | ORAL | Status: AC
  Filled 2018-01-06: qty 1

## 2018-01-06 MED ORDER — ONDANSETRON HCL 4 MG/2ML IJ SOLN
INTRAMUSCULAR | Status: DC | PRN
  Administered 2018-01-06: 4 mg via INTRAVENOUS

## 2018-01-06 MED ORDER — MIDAZOLAM HCL 5 MG/5ML IJ SOLN
INTRAMUSCULAR | Status: DC | PRN
  Administered 2018-01-06: 2 mg via INTRAVENOUS

## 2018-01-06 MED ORDER — PROPOFOL 10 MG/ML IV BOLUS
INTRAVENOUS | Status: DC | PRN
  Administered 2018-01-06: 200 mg via INTRAVENOUS

## 2018-01-06 MED ORDER — 0.9 % SODIUM CHLORIDE (POUR BTL) OPTIME
TOPICAL | Status: DC | PRN
  Administered 2018-01-06: 1000 mL

## 2018-01-06 MED ORDER — HYDROMORPHONE HCL 1 MG/ML IJ SOLN
0.2500 mg | INTRAMUSCULAR | Status: DC | PRN
  Administered 2018-01-06 (×4): 0.5 mg via INTRAVENOUS

## 2018-01-06 MED ORDER — BACITRACIN ZINC 500 UNIT/GM EX OINT
TOPICAL_OINTMENT | CUTANEOUS | Status: AC
  Filled 2018-01-06: qty 28.35

## 2018-01-06 MED ORDER — LACTATED RINGERS IV SOLN
INTRAVENOUS | Status: DC
  Administered 2018-01-06 – 2018-01-08 (×2): via INTRAVENOUS

## 2018-01-06 MED ORDER — SUGAMMADEX SODIUM 200 MG/2ML IV SOLN
INTRAVENOUS | Status: DC | PRN
  Administered 2018-01-06: 100 mg via INTRAVENOUS

## 2018-01-06 MED ORDER — ACETAMINOPHEN 10 MG/ML IV SOLN: 1000.0000 mg | Freq: Once | INTRAVENOUS | Status: DC | PRN

## 2018-01-06 SURGICAL SUPPLY — 47 items
BLADE SURG 10 STRL SS (BLADE) ×1 IMPLANT
BNDG CMPR MED 15X6 ELC VLCR LF (GAUZE/BANDAGES/DRESSINGS) ×1
BNDG COHESIVE 4X5 TAN STRL (GAUZE/BANDAGES/DRESSINGS) ×3 IMPLANT
BNDG ELASTIC 6X15 VLCR STRL LF (GAUZE/BANDAGES/DRESSINGS) ×2 IMPLANT
BNDG GAUZE ELAST 4 BULKY (GAUZE/BANDAGES/DRESSINGS) ×4 IMPLANT
BNDG GAUZE STRTCH 6 (GAUZE/BANDAGES/DRESSINGS) ×3 IMPLANT
BRUSH SCRUB SURG 4.25 DISP (MISCELLANEOUS) ×4 IMPLANT
CANISTER WOUNDNEG PRESSURE 500 (CANNISTER) ×2 IMPLANT
CHLORAPREP W/TINT 26ML (MISCELLANEOUS) ×5 IMPLANT
COVER SURGICAL LIGHT HANDLE (MISCELLANEOUS) ×4 IMPLANT
DRAPE OEC MINIVIEW 54X84 (DRAPES) ×2 IMPLANT
DRAPE U-SHAPE 47X51 STRL (DRAPES) ×3 IMPLANT
DRSG ADAPTIC 3X8 NADH LF (GAUZE/BANDAGES/DRESSINGS) ×3 IMPLANT
DRSG VAC ATS MED SENSATRAC (GAUZE/BANDAGES/DRESSINGS) ×2 IMPLANT
ELECT CAUTERY BLADE 6.4 (BLADE) IMPLANT
ELECT REM PT RETURN 9FT ADLT (ELECTROSURGICAL) ×3
ELECTRODE REM PT RTRN 9FT ADLT (ELECTROSURGICAL) IMPLANT
GAUZE SPONGE 4X4 12PLY STRL (GAUZE/BANDAGES/DRESSINGS) ×1 IMPLANT
GLOVE BIO SURGEON STRL SZ7.5 (GLOVE) ×10 IMPLANT
GLOVE BIOGEL PI IND STRL 7.5 (GLOVE) ×1 IMPLANT
GLOVE BIOGEL PI INDICATOR 7.5 (GLOVE) ×2
GOWN STRL REUS W/ TWL LRG LVL3 (GOWN DISPOSABLE) ×2 IMPLANT
GOWN STRL REUS W/TWL LRG LVL3 (GOWN DISPOSABLE) ×9
HANDPIECE INTERPULSE COAX TIP (DISPOSABLE) ×3
KIT BASIN OR (CUSTOM PROCEDURE TRAY) ×3 IMPLANT
KIT TURNOVER KIT B (KITS) ×3 IMPLANT
MANIFOLD NEPTUNE II (INSTRUMENTS) ×3 IMPLANT
NS IRRIG 1000ML POUR BTL (IV SOLUTION) ×3 IMPLANT
PACK ORTHO EXTREMITY (CUSTOM PROCEDURE TRAY) ×3 IMPLANT
PAD ARMBOARD 7.5X6 YLW CONV (MISCELLANEOUS) ×6 IMPLANT
PAD CAST 4YDX4 CTTN HI CHSV (CAST SUPPLIES) IMPLANT
PADDING CAST COTTON 4X4 STRL (CAST SUPPLIES) ×3
PADDING CAST COTTON 6X4 STRL (CAST SUPPLIES) ×3 IMPLANT
SET HNDPC FAN SPRY TIP SCT (DISPOSABLE) IMPLANT
SPONGE LAP 18X18 X RAY DECT (DISPOSABLE) ×3 IMPLANT
STOCKINETTE IMPERVIOUS 9X36 MD (GAUZE/BANDAGES/DRESSINGS) ×1 IMPLANT
SUT ETHILON 2 0 PSLX (SUTURE) ×4 IMPLANT
SUT ETHILON O TP 1 (SUTURE) ×2 IMPLANT
SUT MON AB 2-0 CT1 36 (SUTURE) ×4 IMPLANT
SUT PDS AB 2-0 CT1 27 (SUTURE) IMPLANT
TOWEL OR 17X24 6PK STRL BLUE (TOWEL DISPOSABLE) ×1 IMPLANT
TOWEL OR 17X26 10 PK STRL BLUE (TOWEL DISPOSABLE) ×4 IMPLANT
TUBE CONNECTING 12'X1/4 (SUCTIONS) ×1
TUBE CONNECTING 12X1/4 (SUCTIONS) ×2 IMPLANT
UNDERPAD 30X30 (UNDERPADS AND DIAPERS) ×3 IMPLANT
WATER STERILE IRR 1000ML POUR (IV SOLUTION) ×1 IMPLANT
YANKAUER SUCT BULB TIP NO VENT (SUCTIONS) ×1 IMPLANT

## 2018-01-06 NOTE — Progress Notes (Signed)
Occupational Therapy Treatment Patient Details Name: [name] E Lasch MRN: [id number] DOB: 05-01-1991 Today's Date: 01/06/2018    History of present illness Pt is 27 yo male who fell sustaining a twisting type injury to R knee with tibial plateau fracture. Underwent external fixation and fasciotomies of all 4 compartments  followed by wound vac placement to lateral compartment on 01/03/18.    OT comments  Patient progressing slowly.  Completes bed mobility with min assist and transfers into recliner using rolling walker with min assist.  Cueing for safety and pacing throughout session due to equipment, no losses of balance noted.  Will continue to follow and plan for next session for LB dressing, toileting tasks, transfers.      Follow Up Recommendations  No OT follow up;Supervision/Assistance - 24 hour    Equipment Recommendations  3 in 1 bedside commode;Wheelchair (measurements OT);Wheelchair cushion (measurements OT)    Recommendations for Other Services      Precautions / Restrictions Precautions Precautions: Fall Restrictions Weight Bearing Restrictions: Yes RLE Weight Bearing: Non weight bearing       Mobility Bed Mobility Overal bed mobility: Needs Assistance Bed Mobility: Supine to Sit     Supine to sit: Min assist     General bed mobility comments: Patient able to transition from supine to EOB with min A for management of L LE, cueing for pacing and safety.  Transfers Overall transfer level: Needs assistance Equipment used: Rolling walker (2 wheeled) Transfers: Sit to/from Stand Sit to Stand: Min assist         General transfer comment: minA for safety, balance and to ensure RLE NWB     Balance Overall balance assessment: Needs assistance Sitting-balance support: No upper extremity supported Sitting balance-Leahy Scale: Normal     Standing balance support: Bilateral upper extremity supported;During functional activity Standing balance-Leahy Scale:  Poor Standing balance comment: reliant on B UE support using RW                            ADL either performed or assessed with clinical judgement   ADL Overall ADL's : Needs assistance/impaired                         Toilet Transfer: Minimal assistance;Ambulation;RW(simulated to recliner ) Toilet Transfer Details (indicate cue type and reason): cueing for pacing and safety, no losses of balance and good adherance to NWB R LE          Functional mobility during ADLs: Minimal assistance;Rolling walker       Vision       Perception     Praxis      Cognition Arousal/Alertness: Awake/alert Behavior During Therapy: WFL for tasks assessed/performed Overall Cognitive Status: Within Functional Limits for tasks assessed                                 General Comments: cueing for safety throughout session        Exercises     Shoulder Instructions       General Comments removed foot plate at beginning of session no redness or irritation noted; reapplied foot plate at completion of session     Pertinent Vitals/ Pain       Pain Assessment: 0-10 Pain Score: 8  Pain Location: R LE Pain Descriptors / Indicators: Aching;Discomfort Pain Intervention(s): Limited activity within patient's  tolerance;Repositioned  Home Living                                          Prior Functioning/Environment              Frequency  Min 3X/week        Progress Toward Goals  OT Goals(current goals can now be found in the care plan section)  Progress towards OT goals: Progressing toward goals  Acute Rehab OT Goals Patient Stated Goal: return to home and work OT Goal Formulation: With patient Time For Goal Achievement: 01/18/18 Potential to Achieve Goals: Good  Plan Discharge plan remains appropriate;Frequency remains appropriate    Co-evaluation                 AM-PAC PT "6 Clicks" Daily Activity     Outcome  Measure   Help from another person eating meals?: None Help from another person taking care of personal grooming?: A Little Help from another person toileting, which includes using toliet, bedpan, or urinal?: Total Help from another person bathing (including washing, rinsing, drying)?: A Lot Help from another person to put on and taking off regular upper body clothing?: A Little Help from another person to put on and taking off regular lower body clothing?: Total 6 Click Score: 14    End of Session Equipment Utilized During Treatment: Gait belt;Rolling walker  OT Visit Diagnosis: Unsteadiness on feet (R26.81);Pain Pain - Right/Left: Right Pain - part of body: Leg;Knee   Activity Tolerance Patient tolerated treatment well   Patient Left in chair;with call bell/phone within reach;with chair alarm set;with family/visitor present   Nurse Communication Mobility status;Patient requests pain meds;Precautions        Time: 1526-1550 OT Time Calculation (min): 24 min  Charges: OT General Charges $OT Visit: 1 Visit OT Treatments $Self Care/Home Management : 23-37 mins $Orthotics/Prosthetics Check: 8-22 mins  Chancy Milroy, OTR/L  Pager [phone]    Chancy Milroy 01/06/2018, 5:14 PM

## 2018-01-06 NOTE — Anesthesia Preprocedure Evaluation (Addendum)
Anesthesia Evaluation  Patient identified by MRN, date of birth, ID band Patient awake    Reviewed: Allergy & Precautions, NPO status , Patient's Chart, lab work & pertinent test results  Airway Mallampati: II  TM Distance: >3 FB Neck ROM: Full    Dental no notable dental hx. (+) Chipped, Dental Advisory Given,    Pulmonary neg pulmonary ROS, Current Smoker,    Pulmonary exam normal breath sounds clear to auscultation       Cardiovascular negative cardio ROS Normal cardiovascular exam Rhythm:Regular Rate:Normal     Neuro/Psych    GI/Hepatic   Endo/Other  negative endocrine ROS  Renal/GU      Musculoskeletal   Abdominal (+) + obese,   Peds  Hematology negative hematology ROS (+)   Anesthesia Other Findings   Reproductive/Obstetrics                             Anesthesia Physical Anesthesia Plan  ASA: II  Anesthesia Plan: General   Post-op Pain Management:    Induction: Intravenous  PONV Risk Score and Plan: Treatment may vary due to age or medical condition  Airway Management Planned: Oral ETT  Additional Equipment:   Intra-op Plan:   Post-operative Plan: Extubation in OR  Informed Consent: I have reviewed the patients History and Physical, chart, labs and discussed the procedure including the risks, benefits and alternatives for the proposed anesthesia with the patient or authorized representative who has indicated his/her understanding and acceptance.   Dental advisory given  Plan Discussed with:   Anesthesia Plan Comments:         Anesthesia Quick Evaluation

## 2018-01-06 NOTE — Transfer of Care (Signed)
Immediate Anesthesia Transfer of Care Note  Patient: [name]  Procedure(s) Performed: Closure of fasciotomy wounds (Right Leg Lower)  Patient Location: PACU  Anesthesia Type:General  Level of Consciousness: awake, alert , oriented and patient cooperative  Airway & Oxygen Therapy: Patient Spontanous Breathing  Post-op Assessment: Report given to RN and Post -op Vital signs reviewed and stable  Post vital signs: Reviewed and stable  Last Vitals: 144/79, 79, 16, 95% Vitals Value Taken Time  BP 144/79 01/06/2018 11:26 AM  Temp    Pulse 84 01/06/2018 11:26 AM  Resp 17 01/06/2018 11:26 AM  SpO2 96 % 01/06/2018 11:26 AM  Vitals shown include unvalidated device data.  Last Pain:  Vitals:   01/06/18 0743  TempSrc:   PainSc: 9       Patients Stated Pain Goal: 2 (01/04/18 0604)  Complications: No apparent anesthesia complications

## 2018-01-06 NOTE — Interval H&P Note (Signed)
History and Physical Interval Note:  01/06/2018 9:52 AM  [name]  has presented today for surgery, with the diagnosis of Right tibial plateau fracture  The various methods of treatment have been discussed with the patient and family. After consideration of risks, benefits and other options for treatment, the patient has consented to  Procedure(s): Closure of fasciotomy wounds (Right) as a surgical intervention .  The patient's history has been reviewed, patient examined, no change in status, stable for surgery.  I have reviewed the patient's chart and labs.  Questions were answered to the patient's satisfaction.     Caryn BeeKevin P Mersadie Kavanaugh

## 2018-01-06 NOTE — Anesthesia Postprocedure Evaluation (Signed)
Anesthesia Post Note  Patient: [name]  Procedure(s) Performed: Closure of fasciotomy wounds (Right Leg Lower)     Patient location during evaluation: PACU Anesthesia Type: General Level of consciousness: awake and alert Pain management: pain level controlled Vital Signs Assessment: post-procedure vital signs reviewed and stable Respiratory status: spontaneous breathing, nonlabored ventilation, respiratory function stable and patient connected to nasal cannula oxygen Cardiovascular status: blood pressure returned to baseline and stable Postop Assessment: no apparent nausea or vomiting Anesthetic complications: no    Last Vitals:  Vitals:   01/06/18 1245 01/06/18 1300  BP: (!) 137/57 (!) 155/80  Pulse: 83 73  Resp: 19   Temp: 36.5 C 36.6 C  SpO2: 97% 98%    Last Pain:  Vitals:   01/06/18 1300  TempSrc: Oral  PainSc:                  Trevor IhaStephen A Houser

## 2018-01-06 NOTE — Progress Notes (Signed)
Occupational Therapy Treatment/Splint Check Patient Details Name: [name] E Timpone MRN: [id number] DOB: 1990-08-01 Today's Date: 01/06/2018    History of present illness Pt is 27 yo male who fell sustaining a twisting type injury to R knee with tibial plateau fracture. Underwent external fixation and fasciotomies of all 4 compartments  followed by wound vac placement to lateral compartment on 01/03/18.    OT comments  Upon arrival, pt supine in bed and reporting this his surgery this morning went well. Pt reporting he wore footplate for two hours this AM prior to surgery. [facility] to [facility] right ankle. Pt able to verbalized splint wear schedule. Pt requiring cues to recall for pressure sore checks. Mother present throughout session. Donned footplate. Discussed with RN and educated on footplate management. Will continue to monitor footplate.        Time: [id number] OT Time Calculation (min): 9 min  Charges: OT General Charges $OT Visit: 1 Visit OT Treatments $Orthotics/Prosthetics Check: 8-22 mins  Yancy Knoble MSOT, OTR/L Acute Rehab Pager: [phone] Office: [phone]   Theodoro GristCharis M Bynum Mccullars 01/06/2018, 2:30 PM

## 2018-01-06 NOTE — Op Note (Signed)
OrthopaedicSurgeryOperativeNote (ZOX:[id number]) Date of Surgery: 01/06/2018  Admit Date: 01/03/2018   Diagnoses: Pre-Op Diagnoses: Right bicondylar tibial plateau fracture Right lower extremity compartment syndrome S/p external fixation S/p 4 compartment fasciotomy release   Post-Op Diagnosis: Same  Procedures: 1. CPT 13160-Secondary closure of right leg fasciotomy wound 2. CPT 97605-Incisional wound vac placement  Surgeons: Primary: Roby Lofts, MD   Location:[facility] OR ROOM 03   AnesthesiaGeneral   Antibiotics:Ancef 2g preop   Tourniquettime:* No tourniquets in log * .  EstimatedBloodLoss:25 mL   Complications:None  Specimens:None  Implants: None  IndicationsforSurgery: This is a 27 year old male who had a significantly displaced right bicondylar tibial plateau fracture with associated compartment syndrome.  He was taken emergently by Dr. Magnus Ivan for 4 compartment fasciotomy release along with external fixation.  I was asked to take over his care due to complexity of his injury and need for an orthopedic traumatologist.  I felt that a repeat irrigation and possible closure of the fasciotomy wounds with adjustment of external fixation would be most appropriate.  With a staged planning for ORIF.  I discussed risks of this with the patient. Risks discussed included bleeding requiring blood transfusion, bleeding causing a hematoma, infection, malunion, nonunion, damage to surrounding nerves and blood vessels, pain, hardware prominence or irritation, hardware failure, stiffness, post-traumatic arthritis, DVT/PE, compartment syndrome, and even death.  The patient agreed to proceed with surgery and consent was obtained.  Operative Findings: 1. Successful closure of right lower extremity lateral fasciotomy wound 2. Placement of incisional wound vac to lateral and medial incisions  Procedure: The patient was identified in the preoperative holding area. Consent was  confirmed with the patient and their family and all questions were answered. The operative extremity was marked after confirmation with the patient. he was then brought back to the operating room by our anesthesia colleagues.  He was placed under general anesthetic and carefully transferred over to a radiolucent flat top table.  I placed a bump under his operative hip.  The external fixator was then prepped into the field.  I removed the wound VAC. The operative extremity was then prepped and draped in usual sterile fashion. A preoperative timeout was performed to verify the patient, the procedure, and the extremity. Preoperative antibiotics were dosed.  The medial wound was closed and appeared healthy and viable I did not reopen this.  The lateral wound had healthy viable muscle with no signs of necrosis.  There was bleeding and healthy muscle and tissue.  At this point I used a Cobb elevator to lightly debride the skin edges as well as the muscle.  I then used a low pressure pulsatile lavage to irrigate the wound.  A gram of vancomycin powder was placed into the wound.  A 0 nylon suture was used with retention sutures to approximate the skin edges.  I then used 2-0 Monocryl and 2-0 nylon to close the skin with horizontal mattress fashion.  A fluoroscopic image was obtained to verify that the plateau was in good alignment and out the length.  It was and I made no adjustment to the external fixator.  I then placed a an incisional wound VAC consisting of Adaptic and black foam sponge.  This was connected to 125 mmHg.  I then dressed the ex-fix pin sites with a Curlex and a web roll and Ace wrap for the leg to provide some compression.  He was then awoken from anesthesia and taken to PACU in stable condition.  Post Op Plan/Instructions: Patient  will return to operating room on Friday for definitive ORIF. We will continue lovenox for DVT prophylaxis. Ancef for surgical prophylaxis. Continue incisional wound vac  until Friday's surgery.  I was present and performed the entire surgery.  Truitt MerleKevin Wynne Rozak, MD [facility]

## 2018-01-06 NOTE — Progress Notes (Signed)
0911 Pt to short stay, NPO maint, pt signed consent for surgery.

## 2018-01-06 NOTE — Anesthesia Procedure Notes (Signed)
Procedure Name: Intubation Date/Time: 01/06/2018 10:16 AM Performed by: Moshe Salisbury, CRNA Pre-anesthesia Checklist: Patient identified, Emergency Drugs available, Suction available and Patient being monitored Patient Re-evaluated:Patient Re-evaluated prior to induction Oxygen Delivery Method: Circle System Utilized Preoxygenation: Pre-oxygenation with 100% oxygen Induction Type: IV induction Ventilation: Mask ventilation without difficulty Laryngoscope Size: Mac and 4 Grade View: Grade I Tube type: Oral Tube size: 8.0 mm Number of attempts: 1 Airway Equipment and Method: Stylet Placement Confirmation: ETT inserted through vocal cords under direct vision,  positive ETCO2 and breath sounds checked- equal and bilateral Secured at: 23 cm Tube secured with: Tape Dental Injury: Teeth and Oropharynx as per pre-operative assessment

## 2018-01-06 NOTE — Plan of Care (Signed)

## 2018-01-07 ENCOUNTER — Encounter (HOSPITAL_COMMUNITY): Payer: Self-pay | Admitting: Student

## 2018-01-07 LAB — CBC
HCT: 32 % — ABNORMAL LOW (ref 39.0–52.0)
HEMOGLOBIN: 10.8 g/dL — AB (ref 13.0–17.0)
MCH: 30.8 pg (ref 26.0–34.0)
MCHC: 33.8 g/dL (ref 30.0–36.0)
MCV: 91.2 fL (ref 78.0–100.0)
Platelets: 198 10*3/uL (ref 150–400)
RBC: 3.51 MIL/uL — ABNORMAL LOW (ref 4.22–5.81)
RDW: 11.9 % (ref 11.5–15.5)
WBC: 7.5 10*3/uL (ref 4.0–10.5)

## 2018-01-07 NOTE — Progress Notes (Signed)
Physical Therapy Treatment Patient Details Name: [name] MRN: [id number] DOB: 1991/04/26 Today's Date: 01/07/2018    History of Present Illness Pt is 27 yo male who fell sustaining a twisting type injury to R knee with tibial plateau fracture. Underwent external fixation and fasciotomies of all 4 compartments  followed by wound vac placement to lateral compartment on 01/03/18, Secondary closure of right leg fasciotomy wound, VAC medial and lateral fasciotomy wounds; possible ORIF tibial plateau fx on  8/30     PT Comments    Pt is progressing well, incr amb distance today, able to maintain NWB throughout distance; will continue to follow in acute setting  Follow Up Recommendations  Outpatient PT     Equipment Recommendations  Rolling walker with 5" wheels;3in1 (PT);Wheelchair (measurements PT)    Recommendations for Other Services       Precautions / Restrictions Precautions Precautions: Fall Restrictions Weight Bearing Restrictions: Yes RLE Weight Bearing: Non weight bearing    Mobility  Bed Mobility Overal bed mobility: Needs Assistance Bed Mobility: Supine to Sit;Sit to Supine     Supine to sit: Supervision Sit to supine: Supervision   General bed mobility comments: supervision for safety  Transfers Overall transfer level: Needs assistance Equipment used: Rolling walker (2 wheeled) Transfers: Sit to/from Stand Sit to Stand: Min guard         General transfer comment: min/guard for safety with transition to RW, cues NWB  Ambulation/Gait Ambulation/Gait assistance: Min guard Gait Distance (Feet): 40 Feet Assistive device: Rolling walker (2 wheeled)       General Gait Details: cues for RW safety and position, NWB; pt able to maintain NWB throughout distance   Stairs             Wheelchair Mobility    Modified Rankin (Stroke Patients Only)       Balance               Standing balance comment: reliant on B UE support using RW                              Cognition Arousal/Alertness: Awake/alert Behavior During Therapy: WFL for tasks assessed/performed Overall Cognitive Status: Within Functional Limits for tasks assessed                                        Exercises General Exercises - Lower Extremity Ankle Circles/Pumps: AROM;Both;10 reps;Limitations Ankle Circles/Pumps Limitations: requires assist to move through greater ROM on R    General Comments        Pertinent Vitals/Pain Pain Assessment: 0-10 Pain Score: 5  Pain Location: R LE Pain Descriptors / Indicators: Aching;Discomfort Pain Intervention(s): Limited activity within patient's tolerance;Monitored during session;Premedicated before session;Repositioned    Home Living                      Prior Function            PT Goals (current goals can now be found in the care plan section) Acute Rehab PT Goals Patient Stated Goal: return to home and work PT Goal Formulation: With patient Time For Goal Achievement: 01/18/18 Potential to Achieve Goals: Good Progress towards PT goals: Progressing toward goals    Frequency    Min 5X/week      PT Plan Current plan remains appropriate  Co-evaluation              AM-PAC PT "6 Clicks" Daily Activity  Outcome Measure  Difficulty turning over in bed (including adjusting bedclothes, sheets and blankets)?: A Little Difficulty moving from lying on back to sitting on the side of the bed? : A Little Difficulty sitting down on and standing up from a chair with arms (e.g., wheelchair, bedside commode, etc,.)?: A Little Help needed moving to and from a bed to chair (including a wheelchair)?: A Little Help needed walking in hospital room?: A Little Help needed climbing 3-5 steps with a railing? : A Little 6 Click Score: 18    End of Session   Activity Tolerance: Patient tolerated treatment well Patient left: in bed;with call bell/phone within  reach;with family/visitor present   PT Visit Diagnosis: Pain;Difficulty in walking, not elsewhere classified (R26.2) Pain - Right/Left: Right Pain - part of body: Leg     Time: 1435-1458 PT Time Calculation (min) (ACUTE ONLY): 23 min  Charges:  $Gait Training: 23-37 mins                     [name], PT Pager: [phone] 01/07/2018    Cory ChaletWILLIAMS,[name] 01/07/2018, 4:06 PM

## 2018-01-07 NOTE — Progress Notes (Signed)
Orthopaedic Trauma Progress Note  S: Patient doing well.  Leg feels better.  He states that he was able to void after the Foley was removed.  Has been working on trying to move his ankle.  Denies any chest pain or shortness of breath.  O:  Vitals:   01/07/18 0918 01/07/18 1158  BP: (!) 144/60 135/81  Pulse: 74 74  Resp: 16 14  Temp: 98.2 F (36.8 C) 98.5 F (36.9 C)  SpO2: 98% 100%   RLE: Ex-fix in place. Dressing clean and dry. Leg swollen. Wiggles toes, pain with motion of ankle. Warm and well perfused. Diminished sensation to dorsum of foot.  Imaging: No new imaging  Labs:  Results for orders placed or performed during the hospital encounter of 01/03/18 (from the past 24 hour(s))  CBC     Status: Abnormal   Collection Time: 01/07/18  5:35 AM  Result Value Ref Range   WBC 7.5 4.0 - 10.5 K/uL   RBC 3.51 (L) 4.22 - 5.81 MIL/uL   Hemoglobin 10.8 (L) 13.0 - 17.0 g/dL   HCT 54.032.0 (L) 98.139.0 - 19.152.0 %   MCV 91.2 78.0 - 100.0 fL   MCH 30.8 26.0 - 34.0 pg   MCHC 33.8 30.0 - 36.0 g/dL   RDW 47.811.9 29.511.5 - 62.115.5 %   Platelets 198 150 - 400 K/uL    Assessment: 27 year old male s/p fall  Injuries: Right bicondylar tibial plateau with compartment syndrome s/p ex-fix and compartment release  Closed wounds, return tomorrow for ORIF of right tibial plateau fracture  Foot plate to prevent equinus contracture.   Weightbearing: NWB RLE  Insicional and dressing care: Wound vac and ACE wrap  Orthopedic device(s):None  CV/Blood loss: Hemodynamically stable, Hgb 10.8, will not check tomorrow AM  Pain management: 1. Gabapentin 300 mg TID 2. Dilaudid 1-2 mg q 2hours PRN pain 3. Robaxin 500 mg q 6hours PRN spasms 4. Oxycodone 5-15 mg q 4 hours PRN pain 5. Toradol 15 mg q 6hr scheduled  VTE prophylaxis: Lovenox 40 mg daily  ID: None needed  Foley/Lines: Foley catheter for urinary retention, continue flomax. Discontinue foley catheter  Medical co-morbidities: None  Impediments to  Fracture Healing: Tobacco use, advised about quitting for bone healing  Dispo: Like home POD 2 after tomorrows surgery  Follow - up plan: TBD   Roby LoftsKevin P. , MD [facility] [phone] (phone)

## 2018-01-07 NOTE — Progress Notes (Signed)
Christina, RN called Haddix,MD to ask if foley can be removed left a message awaiting call back

## 2018-01-07 NOTE — Anesthesia Preprocedure Evaluation (Addendum)
Anesthesia Evaluation  Patient identified by MRN, date of birth, ID band Patient awake    Reviewed: Allergy & Precautions, NPO status , Patient's Chart, lab work & pertinent test results  Airway Mallampati: II  TM Distance: >3 FB Neck ROM: Full    Dental  (+) Dental Advisory Given   Pulmonary Current Smoker,    breath sounds clear to auscultation       Cardiovascular negative cardio ROS   Rhythm:Regular Rate:Normal     Neuro/Psych negative neurological ROS     GI/Hepatic negative GI ROS, Neg liver ROS,   Endo/Other  Morbid obesity  Renal/GU negative Renal ROS     Musculoskeletal   Abdominal   Peds  Hematology  (+) anemia ,   Anesthesia Other Findings   Reproductive/Obstetrics                            Lab Results  Component Value Date   WBC 7.5 01/07/2018   HGB 10.8 (L) 01/07/2018   HCT 32.0 (L) 01/07/2018   MCV 91.2 01/07/2018   PLT 198 01/07/2018   Lab Results  Component Value Date   CREATININE 0.82 01/06/2018   BUN 10 01/06/2018   NA 138 01/06/2018   K 4.0 01/06/2018   CL 103 01/06/2018   CO2 25 01/06/2018    Anesthesia Physical Anesthesia Plan  ASA: II  Anesthesia Plan: General   Post-op Pain Management:    Induction: Intravenous  PONV Risk Score and Plan: 2 and Dexamethasone, Ondansetron and Treatment may vary due to age or medical condition  Airway Management Planned: Oral ETT  Additional Equipment:   Intra-op Plan:   Post-operative Plan: Extubation in OR  Informed Consent: I have reviewed the patients History and Physical, chart, labs and discussed the procedure including the risks, benefits and alternatives for the proposed anesthesia with the patient or authorized representative who has indicated his/her understanding and acceptance.   Dental advisory given  Plan Discussed with: CRNA  Anesthesia Plan Comments:        Anesthesia Quick  Evaluation

## 2018-01-08 ENCOUNTER — Inpatient Hospital Stay (HOSPITAL_COMMUNITY): Payer: Self-pay | Admitting: Certified Registered Nurse Anesthetist

## 2018-01-08 ENCOUNTER — Inpatient Hospital Stay (HOSPITAL_COMMUNITY): Payer: Self-pay

## 2018-01-08 ENCOUNTER — Encounter (HOSPITAL_COMMUNITY): Payer: Self-pay | Admitting: Certified Registered Nurse Anesthetist

## 2018-01-08 ENCOUNTER — Encounter (HOSPITAL_COMMUNITY)
Admission: EM | Disposition: A | Discharge status: Home Health | Payer: Self-pay | Source: Home / Self Care | Attending: Student

## 2018-01-08 HISTORY — PX: ORIF TIBIA PLATEAU: SHX2132

## 2018-01-08 HISTORY — PX: EXTERNAL FIXATION REMOVAL: SHX5040

## 2018-01-08 SURGERY — OPEN REDUCTION INTERNAL FIXATION (ORIF) TIBIAL PLATEAU
Anesthesia: General | Laterality: Right

## 2018-01-08 MED ORDER — HYDROMORPHONE HCL 1 MG/ML IJ SOLN
INTRAMUSCULAR | Status: AC
  Filled 2018-01-08: qty 1

## 2018-01-08 MED ORDER — 0.9 % SODIUM CHLORIDE (POUR BTL) OPTIME
TOPICAL | Status: DC | PRN
  Administered 2018-01-08: 1000 mL

## 2018-01-08 MED ORDER — DEXMEDETOMIDINE HCL IN NACL 200 MCG/50ML IV SOLN
INTRAVENOUS | Status: DC | PRN
  Administered 2018-01-08: 40 ug via INTRAVENOUS
  Administered 2018-01-08: 20 ug via INTRAVENOUS

## 2018-01-08 MED ORDER — HYDROMORPHONE HCL 1 MG/ML IJ SOLN
INTRAMUSCULAR | Status: AC
  Administered 2018-01-10: 2 mg via INTRAVENOUS
  Filled 2018-01-08: qty 1

## 2018-01-08 MED ORDER — BACITRACIN ZINC 500 UNIT/GM EX OINT
TOPICAL_OINTMENT | CUTANEOUS | Status: AC
  Filled 2018-01-08: qty 28.35

## 2018-01-08 MED ORDER — LIDOCAINE 2% (20 MG/ML) 5 ML SYRINGE
INTRAMUSCULAR | Status: AC
  Filled 2018-01-08: qty 10

## 2018-01-08 MED ORDER — ROCURONIUM BROMIDE 10 MG/ML (PF) SYRINGE
PREFILLED_SYRINGE | INTRAVENOUS | Status: DC | PRN
  Administered 2018-01-08: 20 mg via INTRAVENOUS
  Administered 2018-01-08: 30 mg via INTRAVENOUS
  Administered 2018-01-08 (×2): 50 mg via INTRAVENOUS

## 2018-01-08 MED ORDER — HYDROMORPHONE HCL 1 MG/ML IJ SOLN
0.2500 mg | INTRAMUSCULAR | Status: DC | PRN
  Administered 2018-01-08 (×4): 0.5 mg via INTRAVENOUS

## 2018-01-08 MED ORDER — CEFAZOLIN SODIUM-DEXTROSE 2-4 GM/100ML-% IV SOLN
2.0000 g | Freq: Three times a day (TID) | INTRAVENOUS | Status: AC
  Administered 2018-01-08 – 2018-01-09 (×3): 2 g via INTRAVENOUS
  Filled 2018-01-08 (×4): qty 100

## 2018-01-08 MED ORDER — DEXTROSE 5 % IV SOLN
INTRAVENOUS | Status: DC | PRN
  Administered 2018-01-08: 3 g via INTRAVENOUS

## 2018-01-08 MED ORDER — ONDANSETRON HCL 4 MG/2ML IJ SOLN: 4.0000 mg | Freq: Once | INTRAMUSCULAR | Status: DC | PRN

## 2018-01-08 MED ORDER — LIDOCAINE 2% (20 MG/ML) 5 ML SYRINGE
INTRAMUSCULAR | Status: DC | PRN
  Administered 2018-01-08: 100 mg via INTRAVENOUS

## 2018-01-08 MED ORDER — ROCURONIUM BROMIDE 50 MG/5ML IV SOSY
PREFILLED_SYRINGE | INTRAVENOUS | Status: AC
  Filled 2018-01-08: qty 5

## 2018-01-08 MED ORDER — DEXAMETHASONE SODIUM PHOSPHATE 10 MG/ML IJ SOLN
INTRAMUSCULAR | Status: AC
  Filled 2018-01-08: qty 1

## 2018-01-08 MED ORDER — TOBRAMYCIN SULFATE 1.2 G IJ SOLR
INTRAMUSCULAR | Status: AC
  Filled 2018-01-08: qty 1.2

## 2018-01-08 MED ORDER — VANCOMYCIN HCL 1000 MG IV SOLR
INTRAVENOUS | Status: AC
  Filled 2018-01-08: qty 1000

## 2018-01-08 MED ORDER — DEXAMETHASONE SODIUM PHOSPHATE 10 MG/ML IJ SOLN
INTRAMUSCULAR | Status: DC | PRN
  Administered 2018-01-08: 5 mg via INTRAVENOUS

## 2018-01-08 MED ORDER — SUGAMMADEX SODIUM 200 MG/2ML IV SOLN
INTRAVENOUS | Status: DC | PRN
  Administered 2018-01-08: 100 mg via INTRAVENOUS
  Administered 2018-01-08: 200 mg via INTRAVENOUS

## 2018-01-08 MED ORDER — KETAMINE HCL 10 MG/ML IJ SOLN
INTRAMUSCULAR | Status: DC | PRN
  Administered 2018-01-08: 50 mg via INTRAVENOUS
  Administered 2018-01-08 (×2): 10 mg via INTRAVENOUS

## 2018-01-08 MED ORDER — ONDANSETRON HCL 4 MG/2ML IJ SOLN
INTRAMUSCULAR | Status: AC
  Filled 2018-01-08: qty 2

## 2018-01-08 MED ORDER — FENTANYL CITRATE (PF) 250 MCG/5ML IJ SOLN
INTRAMUSCULAR | Status: AC
  Filled 2018-01-08: qty 5

## 2018-01-08 MED ORDER — PROPOFOL 10 MG/ML IV BOLUS
INTRAVENOUS | Status: DC | PRN
  Administered 2018-01-08: 200 mg via INTRAVENOUS

## 2018-01-08 MED ORDER — TOBRAMYCIN SULFATE 1.2 G IJ SOLR
INTRAMUSCULAR | Status: DC | PRN
  Administered 2018-01-08: 1.2 g

## 2018-01-08 MED ORDER — HYDROMORPHONE HCL 1 MG/ML IJ SOLN
0.2500 mg | INTRAMUSCULAR | Status: DC | PRN
  Administered 2018-01-08 (×2): 0.5 mg via INTRAVENOUS

## 2018-01-08 MED ORDER — MIDAZOLAM HCL 2 MG/2ML IJ SOLN
INTRAMUSCULAR | Status: DC | PRN
  Administered 2018-01-08: 2 mg via INTRAVENOUS

## 2018-01-08 MED ORDER — FENTANYL CITRATE (PF) 250 MCG/5ML IJ SOLN
INTRAMUSCULAR | Status: DC | PRN
  Administered 2018-01-08 (×5): 50 ug via INTRAVENOUS
  Administered 2018-01-08: 150 ug via INTRAVENOUS
  Administered 2018-01-08 (×2): 50 ug via INTRAVENOUS

## 2018-01-08 MED ORDER — VANCOMYCIN HCL 1000 MG IV SOLR
INTRAVENOUS | Status: DC | PRN
  Administered 2018-01-08: 1000 mg

## 2018-01-08 MED ORDER — MIDAZOLAM HCL 2 MG/2ML IJ SOLN
INTRAMUSCULAR | Status: AC
  Filled 2018-01-08: qty 2

## 2018-01-08 MED ORDER — KETAMINE HCL 50 MG/5ML IJ SOSY
PREFILLED_SYRINGE | INTRAMUSCULAR | Status: AC
  Filled 2018-01-08: qty 10

## 2018-01-08 MED ORDER — ROCURONIUM BROMIDE 50 MG/5ML IV SOSY
PREFILLED_SYRINGE | INTRAVENOUS | Status: AC
  Filled 2018-01-08: qty 20

## 2018-01-08 MED ORDER — PROPOFOL 10 MG/ML IV BOLUS
INTRAVENOUS | Status: AC
  Filled 2018-01-08: qty 20

## 2018-01-08 SURGICAL SUPPLY — 89 items
BANDAGE ACE 4X5 VEL STRL LF (GAUZE/BANDAGES/DRESSINGS) ×2 IMPLANT
BANDAGE ACE 6X5 VEL STRL LF (GAUZE/BANDAGES/DRESSINGS) ×2 IMPLANT
BANDAGE ESMARK 6X9 LF (GAUZE/BANDAGES/DRESSINGS) ×1 IMPLANT
BIT DRILL 2.5 X LONG (BIT) ×1
BIT DRILL CALIBR QC 2.8X250 (BIT) ×1 IMPLANT
BIT DRILL QC 3.5X195 (BIT) ×1 IMPLANT
BIT DRILL X LONG 2.5 (BIT) IMPLANT
BLADE CLIPPER SURG (BLADE) IMPLANT
BLADE SURG 15 STRL LF DISP TIS (BLADE) ×1 IMPLANT
BLADE SURG 15 STRL SS (BLADE) ×2
BNDG CMPR 9X6 STRL LF SNTH (GAUZE/BANDAGES/DRESSINGS) ×1
BNDG CMPR MED 15X6 ELC VLCR LF (GAUZE/BANDAGES/DRESSINGS) ×1
BNDG ELASTIC 6X15 VLCR STRL LF (GAUZE/BANDAGES/DRESSINGS) ×1 IMPLANT
BNDG ESMARK 6X9 LF (GAUZE/BANDAGES/DRESSINGS) ×2
BNDG GAUZE ELAST 4 BULKY (GAUZE/BANDAGES/DRESSINGS) ×4 IMPLANT
BRUSH SCRUB SURG 4.25 DISP (MISCELLANEOUS) ×4 IMPLANT
CANISTER SUCT 3000ML PPV (MISCELLANEOUS) ×2 IMPLANT
CHLORAPREP W/TINT 26ML (MISCELLANEOUS) ×4 IMPLANT
COVER SURGICAL LIGHT HANDLE (MISCELLANEOUS) ×4 IMPLANT
CUFF TOURNIQUET SINGLE 34IN LL (TOURNIQUET CUFF) ×2 IMPLANT
DRAPE C-ARM 42X72 X-RAY (DRAPES) ×2 IMPLANT
DRAPE C-ARMOR (DRAPES) ×2 IMPLANT
DRAPE ORTHO SPLIT 77X108 STRL (DRAPES) ×4
DRAPE SURG ORHT 6 SPLT 77X108 (DRAPES) ×2 IMPLANT
DRAPE U-SHAPE 47X51 STRL (DRAPES) ×2 IMPLANT
DRILL BIT X LONG 2.5 (BIT) ×2
DRSG ADAPTIC 3X8 NADH LF (GAUZE/BANDAGES/DRESSINGS) ×2 IMPLANT
DRSG MEPILEX BORDER 4X4 (GAUZE/BANDAGES/DRESSINGS) ×1 IMPLANT
DRSG MEPILEX BORDER 4X8 (GAUZE/BANDAGES/DRESSINGS) ×1 IMPLANT
DRSG MEPITEL 4X7.2 (GAUZE/BANDAGES/DRESSINGS) ×2 IMPLANT
DRSG PAD ABDOMINAL 8X10 ST (GAUZE/BANDAGES/DRESSINGS) ×4 IMPLANT
DRSG VAC ATS MED SENSATRAC (GAUZE/BANDAGES/DRESSINGS) ×1 IMPLANT
ELECT REM PT RETURN 9FT ADLT (ELECTROSURGICAL) ×2
ELECTRODE REM PT RTRN 9FT ADLT (ELECTROSURGICAL) ×1 IMPLANT
GAUZE SPONGE 4X4 12PLY STRL (GAUZE/BANDAGES/DRESSINGS) ×2 IMPLANT
GLOVE BIO SURGEON STRL SZ7.5 (GLOVE) ×8 IMPLANT
GLOVE BIOGEL PI IND STRL 7.5 (GLOVE) ×1 IMPLANT
GLOVE BIOGEL PI INDICATOR 7.5 (GLOVE) ×1
GOWN STRL REUS W/ TWL LRG LVL3 (GOWN DISPOSABLE) ×2 IMPLANT
GOWN STRL REUS W/TWL LRG LVL3 (GOWN DISPOSABLE) ×4
IMMOBILIZER KNEE 22 UNIV (SOFTGOODS) ×3 IMPLANT
KIT BASIN OR (CUSTOM PROCEDURE TRAY) ×2 IMPLANT
KIT TURNOVER KIT B (KITS) ×2 IMPLANT
MANIFOLD NEPTUNE II (INSTRUMENTS) ×2 IMPLANT
NDL SUT 6 .5 CRC .975X.05 MAYO (NEEDLE) ×1 IMPLANT
NEEDLE MAYO TAPER (NEEDLE) ×4
NS IRRIG 1000ML POUR BTL (IV SOLUTION) ×2 IMPLANT
PACK TOTAL JOINT (CUSTOM PROCEDURE TRAY) ×2 IMPLANT
PAD ARMBOARD 7.5X6 YLW CONV (MISCELLANEOUS) ×4 IMPLANT
PAD CAST 4YDX4 CTTN HI CHSV (CAST SUPPLIES) ×1 IMPLANT
PADDING CAST ABS 6INX4YD NS (CAST SUPPLIES) ×1
PADDING CAST ABS COTTON 6X4 NS (CAST SUPPLIES) IMPLANT
PADDING CAST COTTON 4X4 STRL (CAST SUPPLIES) ×2
PADDING CAST COTTON 6X4 STRL (CAST SUPPLIES) ×6 IMPLANT
PLATE PROX TIBIA VA LCP 3.5MM (Plate) ×1 IMPLANT
SCREW CORT HEADED ST 3.5X32 (Screw) ×1 IMPLANT
SCREW HEADED ST 3.5X34 (Screw) ×1 IMPLANT
SCREW HEADED ST 3.5X42 (Screw) ×1 IMPLANT
SCREW HEADED ST 3.5X68 (Screw) ×1 IMPLANT
SCREW HEADED ST 3.5X80 (Screw) ×2 IMPLANT
SCREW LOCKING 3.5X70MM VA (Screw) ×1 IMPLANT
SCREW LOCKING 3.5X80MM VA (Screw) ×2 IMPLANT
SCREW LOCKING VA 3.5X85MM (Screw) ×1 IMPLANT
SCREW SHANZ 5.0X170 (Screw) ×2 IMPLANT
SPONGE LAP 18X18 X RAY DECT (DISPOSABLE) ×2 IMPLANT
STAPLER VISISTAT 35W (STAPLE) ×2 IMPLANT
SUCTION FRAZIER HANDLE 10FR (MISCELLANEOUS) ×2
SUCTION TUBE FRAZIER 10FR DISP (MISCELLANEOUS) ×1 IMPLANT
SUT ETHILON 2 0 FS 18 (SUTURE) ×3 IMPLANT
SUT ETHILON 3 0 FSL (SUTURE) ×2 IMPLANT
SUT ETHILON 3 0 PS 1 (SUTURE) IMPLANT
SUT FIBERWIRE #2 38 T-5 BLUE (SUTURE)
SUT MNCRL AB 3-0 PS2 18 (SUTURE) ×2 IMPLANT
SUT MON AB 2-0 CT1 36 (SUTURE) ×2 IMPLANT
SUT VIC AB 0 CT1 27 (SUTURE) ×2
SUT VIC AB 0 CT1 27XBRD ANBCTR (SUTURE) IMPLANT
SUT VIC AB 1 CT1 18XCR BRD 8 (SUTURE) IMPLANT
SUT VIC AB 1 CT1 27 (SUTURE) ×2
SUT VIC AB 1 CT1 27XBRD ANBCTR (SUTURE) ×1 IMPLANT
SUT VIC AB 1 CT1 8-18 (SUTURE) ×2
SUT VIC AB 2-0 CT1 27 (SUTURE) ×6
SUT VIC AB 2-0 CT1 TAPERPNT 27 (SUTURE) ×2 IMPLANT
SUTURE FIBERWR #2 38 T-5 BLUE (SUTURE) IMPLANT
TOWEL OR 17X24 6PK STRL BLUE (TOWEL DISPOSABLE) ×4 IMPLANT
TOWEL OR 17X26 10 PK STRL BLUE (TOWEL DISPOSABLE) ×4 IMPLANT
TRAY FOLEY MTR SLVR 16FR STAT (SET/KITS/TRAYS/PACK) IMPLANT
UNDERPAD 30X30 (UNDERPADS AND DIAPERS) ×2 IMPLANT
WATER STERILE IRR 1000ML POUR (IV SOLUTION) ×4 IMPLANT
WND VAC CANISTER 500ML (MISCELLANEOUS) ×1 IMPLANT

## 2018-01-08 NOTE — Transfer of Care (Signed)
Immediate Anesthesia Transfer of Care Note  Patient: [name]  Procedure(s) Performed: OPEN REDUCTION INTERNAL FIXATION (ORIF) TIBIAL PLATEAU (Right ) REMOVAL EXTERNAL FIXATION LEG (Right )  Patient Location: PACU  Anesthesia Type:General  Level of Consciousness: drowsy and patient cooperative  Airway & Oxygen Therapy: Patient Spontanous Breathing and Patient connected to nasal cannula oxygen  Post-op Assessment: Report given to RN and Post -op Vital signs reviewed and stable  Post vital signs: Reviewed and stable  Last Vitals:  Vitals Value Taken Time  BP 158/85 01/08/2018 11:13 AM  Temp    Pulse 85 01/08/2018 11:14 AM  Resp 18 01/08/2018 11:14 AM  SpO2 97 % 01/08/2018 11:14 AM  Vitals shown include unvalidated device data.  Last Pain:  Vitals:   01/08/18 0550  TempSrc:   PainSc: 5       Patients Stated Pain Goal: 1 (01/07/18 1809)  Complications: No apparent anesthesia complications

## 2018-01-08 NOTE — Anesthesia Procedure Notes (Signed)
Procedure Name: Intubation Date/Time: 01/08/2018 8:37 AM Performed by: White, Amedeo Plenty, CRNA Pre-anesthesia Checklist: Patient identified, Emergency Drugs available, Suction available and Patient being monitored Patient Re-evaluated:Patient Re-evaluated prior to induction Oxygen Delivery Method: Circle System Utilized Preoxygenation: Pre-oxygenation with 100% oxygen Induction Type: IV induction Ventilation: Mask ventilation without difficulty Laryngoscope Size: Mac and 4 Grade View: Grade I Tube type: Oral Tube size: 7.5 mm Number of attempts: 1 Airway Equipment and Method: Stylet and Oral airway Placement Confirmation: ETT inserted through vocal cords under direct vision,  positive ETCO2 and breath sounds checked- equal and bilateral Secured at: 23 cm Tube secured with: Tape Dental Injury: Teeth and Oropharynx as per pre-operative assessment

## 2018-01-08 NOTE — Interval H&P Note (Signed)
History and Physical Interval Note:  01/08/2018 7:03 AM  [name]  has presented today for surgery, with the diagnosis of Right tibial plateau fracture  The various methods of treatment have been discussed with the patient and family. After consideration of risks, benefits and other options for treatment, the patient has consented to  Procedure(s): OPEN REDUCTION INTERNAL FIXATION (ORIF) TIBIAL PLATEAU (Right) REMOVAL EXTERNAL FIXATION LEG (Right) as a surgical intervention .  The patient's history has been reviewed, patient examined, no change in status, stable for surgery.  I have reviewed the patient's chart and labs.  Questions were answered to the patient's satisfaction.     Caryn BeeKevin P Haddix

## 2018-01-08 NOTE — Anesthesia Postprocedure Evaluation (Signed)
Anesthesia Post Note  Patient: [name]  Procedure(s) Performed: OPEN REDUCTION INTERNAL FIXATION (ORIF) TIBIAL PLATEAU (Right ) REMOVAL EXTERNAL FIXATION LEG (Right )     Patient location during evaluation: PACU Anesthesia Type: General Level of consciousness: awake and alert Pain management: pain level controlled Vital Signs Assessment: post-procedure vital signs reviewed and stable Respiratory status: spontaneous breathing, nonlabored ventilation, respiratory function stable and patient connected to nasal cannula oxygen Cardiovascular status: blood pressure returned to baseline and stable Postop Assessment: no apparent nausea or vomiting Anesthetic complications: no    Last Vitals:  Vitals:   01/08/18 1242 01/08/18 1407  BP: (!) 150/74 (!) 159/69  Pulse:  90  Resp:  16  Temp:  37.6 C  SpO2:  100%    Last Pain:  Vitals:   01/08/18 1424  TempSrc:   PainSc: 10-Worst pain ever                 [name], Asuzena Weis E

## 2018-01-08 NOTE — Progress Notes (Signed)
OT Cancellation Note  Patient Details Name: [name] MRN: [id number] DOB: Apr 12, 1991   Cancelled Treatment:    Reason Eval/Treat Not Completed: Patient at procedure or test/ unavailable (OR), will follow.  Marcy SirenBreanna Hannalee Castor, OT Pager [phone] 01/08/2018   [name] 01/08/2018, 7:53 AM

## 2018-01-08 NOTE — Op Note (Signed)
[facility]edic[facility]gery[facility]ative[facility] (ZOX:[id number]) Date of Surgery: 01/08/2018  Admit Date: 01/03/2018   Diagnoses: Pre-Op Diagnoses: Right bicondylar tibial plateau fracture Right lower leg compartment syndrome S/p external fixation S/p 4 compartment fasciotomy release   Post-Op Diagnosis: Same  Procedures: 1.CPT 27536-Open reduction internal fixation of right bicondylar tibial plateau fracture 2. CPT 27540-Open reduction internal fixation of right tibial tuberosity 3. CPT 20694-Removal of external fixator 4. CPT 97605-Incisional wound vac placement  Surgeons: Primary: Roby Lofts, MD   Location:[facility] OR ROOM 07   AnesthesiaGeneral   Antibiotics:Ancef 3g preop   Tourniquettime: Total Tourniquet Time Documented: Thigh (Right) - 110 minutes Total: Thigh (Right) - 110 minutes  EstimatedBloodLoss:200 mL   Complications: None  Specimens:None  Implants: Implant Name Type Inv. Item Serial No. Manufacturer Lot No. LRB No. Used Action  SCREW SHANZ 5.0X170 - [id number] Screw SCREW SHANZ 5.0X170  [employer]  Right 2 Implanted  SCREW HEADED ST 3.5X68 - [id number] Screw SCREW HEADED ST 3.5X68  [employer]  Right 1 Implanted  SCREW HEADED ST 3.5X80 - [id number] Screw SCREW HEADED ST 3.5X80  [employer]  Right 2 Implanted  PLATE PROX TIBIA VA LCP 3.5MM - [id number] Plate PLATE PROX TIBIA VA LCP 3.5MM  [employer]  Right 1 Implanted  SCREW HEADED ST 3.5X42 - [id number] Screw SCREW HEADED ST 3.5X42  [employer]  Right 1 Implanted  SCREW HEADED ST 3.5X34 - [id number] Screw SCREW HEADED ST 3.5X34  [employer]  Right 1 Implanted  SCREW CORT HEADED ST 3.5X32 - [id number] Screw SCREW CORT HEADED ST 3.5X32  [employer]  Right 1 Implanted  SCREW LOCKING 3.5X80MM VA - [id number] Screw SCREW LOCKING 3.5X80MM VA  [employer]  Right 2 Implanted  SCREW LOCKING VA 3.5X85MM - [id number] Screw SCREW LOCKING VA 3.5X85MM  [employer]  Right 1 Implanted  SCREW LOCKING  3.5X70MM VA - [id number] Screw SCREW LOCKING 3.5X70MM VA  [employer]  Right 1 Implanted    IndicationsforSurgery: This is a 27 year old male who had a significantly displaced right bicondylar tibial plateau fracture with associated compartment syndrome.  He was taken emergently by Dr. Magnus Ivan for 4 compartment fasciotomy release along with external fixation. I recommended proceeding with ORIF of the right tibial plateau fracture.  I discussed risks of this with the patient. Risks discussed included bleeding requiring blood transfusion, bleeding causing a hematoma, infection, malunion, nonunion, damage to surrounding nerves and blood vessels, pain, hardware prominence or irritation, hardware failure, stiffness, post-traumatic arthritis, DVT/PE, compartment syndrome, and even death.  The patient agreed to proceed with surgery and consent was obtained.  Operative Findings: 1.  Significantly displaced right tibial plateau fracture with significantly displaced articular surface requiring tibial tubercle osteotomy to access the depressed fragment. 2.  Open reduction internal fixation of right tibial plateau fracture and tibial tuberosity fractures using a Synthes 3.5 mm VA proximal tibial locking plate 3.  Intact lateral meniscus 4.  Grade 2 MCL tear 5.  Removal of external fixation and an incisional wound VAC placement  Procedure: The patient was identified in the preoperative holding area. Consent was confirmed with the patient and their family and all questions were answered. The operative extremity was marked after confirmation with the patient. he was then brought back to the operating room by our anesthesia colleagues.  He was then placed under general anesthetic.  He was carefully transferred over to a radiolucent flat top table.  A bump was placed under his operative hip.  The external fixator was then removed. The operative  extremity was then prepped and draped in usual sterile fashion. A  preoperative timeout was performed to verify the patient, the procedure, and the extremity. Preoperative antibiotics were dosed.  A sterile tourniquet was placed in upper thigh.  Fluoroscopy was used to identify the significant displacement of the fracture.  An Esmarch was used to exsanguinate the extremity and then the tourniquet was inflated to 300 mmHg. An anterolateral parapatellar incision was performed and carried through skin and subcutaneous tissue. The overlying fascia was incised in line with the skin incision just lateral to the patellar tendon. It was extended distally into the anterior compartment fascia. Bovie electrocautery was then used to elevated the IT band and musculature off of the anterolateral cortex of the tibia. The release was taken back until the fibular head was palpable. The plane between the IT band and the lateral capsule was developed and the anterior fat pad was resected to expose the capsule.  A submensical arthrotomy was then performed with a 15 blade. Tag sutures were used to retract the capsule and here I was able to visualize the impacted lateral joint and was able to see the meniscus.  The lateral meniscus was intact to the meniscocapsular junction.  No repair was needed.  The anterior split was underneath the patellar tendon at the location of the tibial tubercle.  Unfortunately I would not able to be access the fracture unless I released the lateral portion of the patellar tendon.  I did not feel that this was appropriate.  As result I performed an osteotomy just lateral to the patella tendon.  I used a 2 5 drill bit and an osteotome to osteotomize the lateral condyle tibia and access the cancellus bone and the fracture.  I was able to access the fracture in remove small osteochondral fragments.  There was a large osteochondral portion of the lateral joint transition to the intercondylar spine.  I was able to disimpact that and reduce it and hold it provisionally with a  medially based K wire.  I then proceeded to place the AO distractor with a 5.0 mm Schanz pin in the distal femur and one in the tibial shaft.  I distracted the joint to provide a medial force to help with the reduction of the lateral condyle.  A large periarticular reduction clamp was placed to reduce the lateral condyle.  Anatomic reduction was obtained.  I then placed 3.5 mm lag screws from the lateral side into the medial condyle thereby enabling me to remove my periarticular reduction clamp.  A 3.82mm LCP proximal tibial locking plate was chosen and was aligned to the tibia in both the AP and lateral fluoroscopic views.  A nonlocking screw was placed in the proximal segment to bring the plate flush to bone.  3 nonlocking screws were then placed in the shaft to bring the distal portion of the plate to the lateral cortex.  For more locking screws were then placed in the proximal segment.  Final fluoroscopic images were obtained.  Stress view showed that there was a significant MCL injury approximately grade 2.  I felt that this did not need to be opened up and addressed.  The tourniquet was dropped and hemostasis was obtained. The incision was thoroughly irrigated. The tag sutures for the capsule were brought through the plate and tied down . One gram of vancomycin powder and 1.2 g of tobramycin powder were placed in the wound and the IT band and anterior tibialis fascia was closed with 0-vicryl  suture. The skin was closed with 2-0 vicryl and 3-0 nylon. The percutaneous incisions were closed with 3-0 nylon suture.  An incisional wound VAC was placed over all the incisions.  The knee and leg were dressed with sterile cast padding and an ACE wrap and was placed in a knee immobilizer. The patient was then awoken from anesthesia and taken to PACU in stable condition.  Post Op Plan/Instructions: The patient will be nonweightbearing to the right lower extremity.  He will be transition to a hinged knee brace to  start gentle range of motion the knee.  There are no restrictions from range of motion standpoint.  He will receive postoperative Ancef.  He will continue to receive Lovenox in the hospital will be discharged on aspirin.  His incisional wound VAC will be changed on postoperative day 2.  I was present and performed the entire surgery.  Truitt MerleKevin Haddix, MD [facility]

## 2018-01-09 LAB — CBC
HCT: 28.3 % — ABNORMAL LOW (ref 39.0–52.0)
Hemoglobin: 9.3 g/dL — ABNORMAL LOW (ref 13.0–17.0)
MCH: 30.5 pg (ref 26.0–34.0)
MCHC: 32.9 g/dL (ref 30.0–36.0)
MCV: 92.8 fL (ref 78.0–100.0)
Platelets: 231 10*3/uL (ref 150–400)
RBC: 3.05 MIL/uL — ABNORMAL LOW (ref 4.22–5.81)
RDW: 12.1 % (ref 11.5–15.5)
WBC: 7.5 10*3/uL (ref 4.0–10.5)

## 2018-01-09 NOTE — Progress Notes (Signed)
Patient ID: [name], male   DOB: 1991-04-22, 27 y.o.   MRN: [id number]     Subjective:  Patient reports pain as mild to moderate.  Patient reports improvment  Objective:   VITALS:   Vitals:   01/08/18 1407 01/08/18 2050 01/09/18 0018 01/09/18 0555  BP: (!) 159/69 (!) 131/56 131/73 (!) 109/58  Pulse: 90 75 79 67  Resp: 16 17 18 17   Temp: 99.6 F (37.6 C) 98.4 F (36.9 C) 98.6 F (37 C)   TempSrc: Oral Oral Oral   SpO2: 100% 98% 98% 99%  Weight:      Height:        ABD soft Sensation intact distally Dorsiflexion/Plantar flexion intact Incision: dressing C/D/I and no drainage bledsoe brace in place  Lab Results  Component Value Date   WBC 7.5 01/09/2018   HGB 9.3 (L) 01/09/2018   HCT 28.3 (L) 01/09/2018   MCV 92.8 01/09/2018   PLT 231 01/09/2018   BMET    Component Value Date/Time   NA 138 01/06/2018 0333   K 4.0 01/06/2018 0333   CL 103 01/06/2018 0333   CO2 25 01/06/2018 0333   GLUCOSE 106 (H) 01/06/2018 0333   BUN 10 01/06/2018 0333   CREATININE 0.82 01/06/2018 0333   CALCIUM 8.5 (L) 01/06/2018 0333   GFRNONAA >60 01/06/2018 0333   GFRAA >60 01/06/2018 0333     Assessment/Plan: 1 Day Post-Op   Principal Problem:   Tibial plateau fracture, right, closed, initial encounter Active Problems:   Compartment syndrome of right lower extremity (HCC)   Closed fracture of lateral portion of right tibial plateau   Advance diet Up with therapy Continue plan per Dr Jena GaussHaddix Plan for dressing change tomorrow    Haskel KhanDOUGLAS [name] 01/09/2018, 9:59 AM   Teryl LucyJoshua Landau, MD [facility] [phone]

## 2018-01-09 NOTE — Progress Notes (Signed)
Physical Therapy Treatment Patient Details Name: [name] E Zeleznik MRN: [id number] DOB: 04-21-1991 Today's Date: 01/09/2018    History of Present Illness Pt is 27 yo male who fell sustaining a twisting type injury to R knee with tibial plateau fracture. Underwent external fixation and fasciotomies of all 4 compartments  followed by wound vac placement to lateral compartment on 01/03/18, Secondary closure of right leg fasciotomy wound, VAC medial and lateral fasciotomy wounds; ORIF tibial plateau fx on 8/30     PT Comments    Patient limited today by fatigue and pain.  Overall, did well with mobility, requiring only min-guard assistance for gait.  Patient continues to progress steadily.   Follow Up Recommendations  Outpatient PT     Equipment Recommendations  Rolling walker with 5" wheels;3in1 (PT);Wheelchair (measurements PT)    Recommendations for Other Services       Precautions / Restrictions Precautions Precautions: Fall Required Braces or Orthoses: Other Brace/Splint Other Brace/Splint: hinged knee brace; able to perform gentle ROM; no ROM restrictions per op note Restrictions RLE Weight Bearing: Non weight bearing    Mobility  Bed Mobility Overal bed mobility: Modified Independent Bed Mobility: Supine to Sit     Supine to sit: Modified independent (Device/Increase time)     General bed mobility comments: used railing  Transfers Overall transfer level: Modified independent Equipment used: Rolling walker (2 wheeled) Transfers: Sit to/from Stand Sit to Stand: Modified independent (Device/Increase time)            Ambulation/Gait Ambulation/Gait assistance: LandMin guard Gait Distance (Feet): 10 Feet Assistive device: Rolling walker (2 wheeled) Gait Pattern/deviations: Step-to pattern     General Gait Details: able to maintain NWB   Stairs             Wheelchair Mobility    Modified Rankin (Stroke Patients Only)       Balance Overall balance  assessment: Needs assistance         Standing balance support: Bilateral upper extremity supported;During functional activity Standing balance-Leahy Scale: Poor Standing balance comment: reliant on B UE support using RW                             Cognition Arousal/Alertness: Awake/alert Behavior During Therapy: WFL for tasks assessed/performed Overall Cognitive Status: Within Functional Limits for tasks assessed                                        Exercises      General Comments        Pertinent Vitals/Pain Pain Assessment: 0-10 Pain Score: 4  Pain Location: R LE Pain Descriptors / Indicators: Aching;Discomfort Pain Intervention(s): Limited activity within patient's tolerance;Monitored during session;Premedicated before session    Home Living                      Prior Function            PT Goals (current goals can now be found in the care plan section) Progress towards PT goals: Progressing toward goals    Frequency    Min 5X/week      PT Plan Current plan remains appropriate    Co-evaluation              AM-PAC PT "6 Clicks" Daily Activity  Outcome Measure  Difficulty turning over in bed (  including adjusting bedclothes, sheets and blankets)?: A Little Difficulty moving from lying on back to sitting on the side of the bed? : A Little Difficulty sitting down on and standing up from a chair with arms (e.g., wheelchair, bedside commode, etc,.)?: A Little Help needed moving to and from a bed to chair (including a wheelchair)?: A Little Help needed walking in hospital room?: A Little Help needed climbing 3-5 steps with a railing? : A Little 6 Click Score: 18    End of Session Equipment Utilized During Treatment: Gait belt Activity Tolerance: Patient tolerated treatment well Patient left: in chair;with call bell/phone within reach   PT Visit Diagnosis: Pain;Difficulty in walking, not elsewhere classified  (R26.2) Pain - Right/Left: Right Pain - part of body: Leg     Time: 8295-6213 PT Time Calculation (min) (ACUTE ONLY): 16 min  Charges:  $Gait Training: 8-22 mins                     01/09/2018 Corlis Hove, PT Acute Rehabilitation Services Pager:  [phone] Office:  [phone]     Olivia Canter 01/09/2018, 3:01 PM

## 2018-01-10 LAB — CREATININE, SERUM
CREATININE: 0.82 mg/dL (ref 0.61–1.24)
GFR calc Af Amer: >60 mL/min (ref >60)

## 2018-01-10 MED ORDER — OXYCODONE HCL 10 MG PO TABS: 10.0000 mg | ORAL_TABLET | ORAL | 0 refills | Status: AC | PRN

## 2018-01-10 MED ORDER — METHOCARBAMOL 750 MG PO TABS: 750.0000 mg | ORAL_TABLET | Freq: Four times a day (QID) | ORAL | 0 refills | Status: DC | PRN

## 2018-01-10 MED ORDER — METHOCARBAMOL 750 MG PO TABS: 750.0000 mg | ORAL_TABLET | Freq: Four times a day (QID) | ORAL | 0 refills | Status: AC | PRN

## 2018-01-10 MED ORDER — OXYCODONE HCL 10 MG PO TABS: 10.0000 mg | ORAL_TABLET | ORAL | 0 refills | Status: DC | PRN

## 2018-01-10 MED ORDER — ASPIRIN EC 325 MG PO TBEC: 325.0000 mg | DELAYED_RELEASE_TABLET | Freq: Every day | ORAL | 0 refills | Status: DC

## 2018-01-10 MED ORDER — ASPIRIN EC 325 MG PO TBEC: 325.0000 mg | DELAYED_RELEASE_TABLET | Freq: Every day | ORAL | 0 refills | Status: AC

## 2018-01-10 NOTE — Care Management Note (Signed)
Case Management Note  Patient Details  Name: [name] MRN: [id number] Date of Birth: 07-10-1990  Subjective/Objective:      Pt presented with tibial plateau fracture.  Pt to d/c home with family.  Pt uninsured.               Action/Plan: Char[facility] DME provided by [facility]- RW, 3n1, WC.    Contact information placed on AVS for clinics to establish primary care.   Expected Discharge Date:  01/10/18               Expected Discharge Plan:  Home w Home Health Services  In-House Referral:  NA  Discharge planning Services  CM Consult  Post Acute Care Choice:  Durable Medical Equipment Choice offered to:  NA  DME Arranged:  3-N-1, Walker rolling, Wheelchair manual DME Agency:  [facility]  HH Arranged:  NA HH Agency:  NA  Status of Service:  Completed, signed off  If discussed at [employer] of Stay Meetings, dates discussed:    Additional Comments:  Deveron Furlong, RN 01/10/2018, 12:43 PM

## 2018-01-10 NOTE — Discharge Instructions (Signed)
Orthopaedic Trauma Service Discharge Instructions   General Discharge Instructions  WEIGHT BEARING STATUS: Do NOT put weight on your leg until told to do so  RANGE OF MOTION/ACTIVITY: You must keep your knee brace on and locked in extension at night. You may unlock it during the day to work on moving the knee.  Wound Care: See instructions below  DVT/PE prophylaxis: Take a full dose aspirin 325mg  every day  Diet: as you were eating previously.  Can use over the counter stool softeners and bowel preparations, such as Miralax, to help with bowel movements.  Narcotics can be constipating.  Be sure to drink plenty of fluids  PAIN MEDICATION USE AND EXPECTATIONS  You have likely been given narcotic medications to help control your pain.  After a traumatic event that results in an fracture (broken bone) with or without surgery, it is ok to use narcotic pain medications to help control one's pain.  We understand that everyone responds to pain differently and each individual patient will be evaluated on a regular basis for the continued need for narcotic medications. Ideally, narcotic medication use should last no more than 6-8 weeks (coinciding with fracture healing).   As a patient it is your responsibility as well to monitor narcotic medication use and report the amount and frequency you use these medications when you come to your office visit.   We would also advise that if you are using narcotic medications, you should take a dose prior to therapy to maximize you participation.  IF YOU ARE ON NARCOTIC MEDICATIONS IT IS NOT PERMISSIBLE TO OPERATE A MOTOR VEHICLE (MOTORCYCLE/CAR/TRUCK/MOPED) OR HEAVY MACHINERY DO NOT MIX NARCOTICS WITH OTHER CNS (CENTRAL NERVOUS SYSTEM) DEPRESSANTS SUCH AS ALCOHOL   STOP SMOKING OR USING NICOTINE PRODUCTS!!!!  As discussed nicotine severely impairs your body's ability to heal surgical and traumatic wounds but also impairs bone healing.  Wounds and bone heal by  forming microscopic blood vessels (angiogenesis) and nicotine is a vasoconstrictor (essentially, shrinks blood vessels).  Therefore, if vasoconstriction occurs to these microscopic blood vessels they essentially disappear and are unable to deliver necessary nutrients to the healing tissue.  This is one modifiable factor that you can do to dramatically increase your chances of healing your injury.    (This means no smoking, no nicotine gum, patches, etc)  DO NOT USE NONSTEROIDAL ANTI-INFLAMMATORY DRUGS (NSAID'S)  Using products such as Advil (ibuprofen), Aleve (naproxen), Motrin (ibuprofen) for additional pain control during fracture healing can delay and/or prevent the healing response.  If you would like to take over the counter (OTC) medication, Tylenol (acetaminophen) is ok.  However, some narcotic medications that are given for pain control contain acetaminophen as well. Therefore, you should not exceed more than 4000 mg of tylenol in a day if you do not have liver disease.  Also note that there are may OTC medicines, such as cold medicines and allergy medicines that my contain tylenol as well.  If you have any questions about medications and/or interactions please ask your doctor/PA or your pharmacist.      ICE AND ELEVATE INJURED/OPERATIVE EXTREMITY  Using ice and elevating the injured extremity above your heart can help with swelling and pain control.  Icing in a pulsatile fashion, such as 20 minutes on and 20 minutes off, can be followed.    Do not place ice directly on skin. Make sure there is a barrier between to skin and the ice pack.    Using frozen items such as frozen  peas works well as the conform nicely to the are that needs to be iced.  USE AN ACE WRAP OR TED HOSE FOR SWELLING CONTROL  In addition to icing and elevation, Ace wraps or TED hose are used to help limit and resolve swelling.  It is recommended to use Ace wraps or TED hose until you are informed to stop.    When using Ace  Wraps start the wrapping distally (farthest away from the body) and wrap proximally (closer to the body)   Example: If you had surgery on your leg or thing and you do not have a splint on, start the ace wrap at the toes and work your way up to the thigh        If you had surgery on your upper extremity and do not have a splint on, start the ace wrap at your fingers and work your way up to the upper arm  IF YOU ARE IN A SPLINT OR CAST DO NOT REMOVE IT FOR ANY REASON   If your splint gets wet for any reason please contact the office immediately. You may shower in your splint or cast as long as you keep it dry.  This can be done by wrapping in a cast cover or garbage back (or similar)  Do Not stick any thing down your splint or cast such as pencils, money, or hangers to try and scratch yourself with.  If you feel itchy take benadryl as prescribed on the bottle for itching  CALL THE OFFICE WITH ANY QUESTIONS OR CONCERNS: [phone]     Discharge Wound Care Instructions  Do NOT apply any ointments, solutions or lotions to pin sites or surgical wounds.  These prevent needed drainage and even though solutions like hydrogen peroxide kill bacteria, they also damage cells lining the pin sites that help fight infection.  Applying lotions or ointments can keep the wounds moist and can cause them to breakdown and open up as well. This can increase the risk for infection. When in doubt call the office.  Surgical incisions should be dressed daily.  If any drainage is noted, use one layer of adaptic, then gauze, Kerlix, and an ace wrap.  Once the incision is completely dry and without drainage, it may be left open to air out.  Showering may begin 36-48 hours later.  Cleaning gently with soap and water.  Traumatic wounds should be dressed daily as well.    One layer of adaptic, gauze, Kerlix, then ace wrap.  The adaptic can be discontinued once the draining has ceased    If you have a wet to dry dressing:  wet the gauze with saline the squeeze as much saline out so the gauze is moist (not soaking wet), place moistened gauze over wound, then place a dry gauze over the moist one, followed by Kerlix wrap, then ace wrap.

## 2018-01-10 NOTE — Progress Notes (Signed)
Physical Therapy Treatment Patient Details Name: [name] MRN: [id number] DOB: 1991-04-28 Today's Date: 01/10/2018    History of Present Illness Pt is 27 yo male who fell sustaining a twisting type injury to R knee with tibial plateau fracture. Underwent external fixation and fasciotomies of all 4 compartments  followed by wound vac placement to lateral compartment on 01/03/18, Secondary closure of right leg fasciotomy wound, VAC medial and lateral fasciotomy wounds; ORIF tibial plateau fx on 8/30 Pt with significant PMH of DM, CA, HTN, asthma.      PT Comments    Pt is progressing well with general mobility and transfers.  He is due to d/c home today and reports they had a ramp put in at the house, so we do not need to practice stairs for d/c.  Pt given HEP handout and reviewed exercises with PT as well as no pillow rule under operative knee.  PT to follow acutely until d/c confirmed.     Follow Up Recommendations  Outpatient PT     Equipment Recommendations  Rolling walker with 5" wheels;3in1 (PT);Wheelchair (measurements PT)    Recommendations for Other Services   NA     Precautions / Restrictions Precautions Precautions: Fall Precaution Comments: due to NWB status of R leg Required Braces or Orthoses: Other Brace/Splint Other Brace/Splint: hinged knee brace; able to perform gentle ROM; no ROM restrictions per op note Restrictions Weight Bearing Restrictions: Yes RLE Weight Bearing: Non weight bearing    Mobility  Bed Mobility Overal bed mobility: Needs Assistance Bed Mobility: Supine to Sit     Supine to sit: Modified independent (Device/Increase time) Sit to supine: Modified independent (Device/Increase time)   General bed mobility comments: Cues for technique and pt using leg loop to progress leg over EOB and brace to bring leg back into bed.  Cues for technique/sequencing from PT.   Transfers Overall transfer level: Needs assistance Equipment used: Rolling  walker (2 wheeled) Transfers: Sit to/from Stand Sit to Stand: Supervision Stand pivot transfers: Supervision       General transfer comment: Supervision for safety.    Ambulation/Gait             General Gait Details: NT, pt reports a ramp was installed at his home and he did not want to get up to the recliner before he left.  Imm d/c when his equipment arrives.           Balance Overall balance assessment: Needs assistance Sitting-balance support: Feet supported;No upper extremity supported Sitting balance-Leahy Scale: Normal     Standing balance support: Bilateral upper extremity supported Standing balance-Leahy Scale: Poor Standing balance comment: needs external support due to NWB status for balance.                             Cognition Arousal/Alertness: Awake/alert Behavior During Therapy: WFL for tasks assessed/performed Overall Cognitive Status: Within Functional Limits for tasks assessed                                        Exercises Total Joint Exercises Ankle Circles/Pumps: AROM;AAROM;Left;Right;10 reps Quad Sets: AROM;Right;10 reps Towel Squeeze: AAROM;Both;10 reps Short Arc QuadBarbaraann Boys;Right;10 reps Heel Slides: AAROM;Right;10 reps Hip ABduction/ADduction: AAROM;Right;10 reps Straight Leg Raises: AAROM;Right;10 reps Knee Flexion: AROM;Right;10 reps;Seated Other Exercises Other Exercises: encouraged incentive spirometer use until his follow up MD  appointment.         Pertinent Vitals/Pain Pain Assessment: Faces Faces Pain Scale: Hurts whole lot Pain Location: right leg with exercises Pain Descriptors / Indicators: Grimacing;Guarding Pain Intervention(s): Limited activity within patient's tolerance;Monitored during session;Repositioned           PT Goals (current goals can now be found in the care plan section) Acute Rehab PT Goals Patient Stated Goal: return to home and work Progress towards PT goals:  Progressing toward goals    Frequency    Min 5X/week      PT Plan Current plan remains appropriate       AM-PAC PT "6 Clicks" Daily Activity  Outcome Measure  Difficulty turning over in bed (including adjusting bedclothes, sheets and blankets)?: A Little Difficulty moving from lying on back to sitting on the side of the bed? : A Little Difficulty sitting down on and standing up from a chair with arms (e.g., wheelchair, bedside commode, etc,.)?: Unable Help needed moving to and from a bed to chair (including a wheelchair)?: A Little Help needed walking in hospital room?: A Little Help needed climbing 3-5 steps with a railing? : A Little 6 Click Score: 16    End of Session Equipment Utilized During Treatment: Right knee immobilizer;Other (comment)(bledsoe brace) Activity Tolerance: Patient limited by pain Patient left: in bed;with call bell/phone within reach;with family/visitor present Nurse Communication: Mobility status PT Visit Diagnosis: Pain;Difficulty in walking, not elsewhere classified (R26.2) Pain - Right/Left: Right Pain - part of body: Leg     Time: 1610-9604 PT Time Calculation (min) (ACUTE ONLY): 37 min  Charges:  $Therapeutic Exercise: 8-22 mins $Therapeutic Activity: 8-22 mins          Maranatha Grossi B. Samentha Perham, PT, DPT [phone]             01/10/2018, 10:34 AM

## 2018-01-10 NOTE — Plan of Care (Signed)
  Problem: Activity: Goal: Risk for activity intolerance will decrease Outcome: Progressing   Problem: Elimination: Goal: Will not experience complications related to bowel motility Outcome: Progressing   Problem: Safety: Goal: Ability to remain free from injury will improve Outcome: Progressing   Problem: Skin Integrity: Goal: Risk for impaired skin integrity will decrease Outcome: Progressing   

## 2018-01-10 NOTE — Discharge Summary (Signed)
Orthopaedic Trauma Service (OTS)  Patient ID: [name] MRN: [id number] DOB/AGE: 06-Mar-1991 27 y.o.  Admit date: 01/03/2018 Discharge date: 01/10/2018  Admission Diagnoses:Tibial plateau fracture, right, closed, initial encounter Active Problems:   Compartment syndrome of right lower extremity (HCC)   Closed fracture of lateral portion of right tibial plateau  Discharge Diagnoses:  Principal Problem:   Tibial plateau fracture, right, closed, initial encounter Active Problems:   Compartment syndrome of right lower extremity (HCC)   Closed fracture of lateral portion of right tibial plateau   History reviewed. No pertinent past medical history.  Procedures Performed: 01/03/2018:1.  External fixation, right lower extremity expanding the knee joint. 2.  Four compartment fasciotomies, right leg. 3.  Placement of the lateral decubital VAC sponge over her right lateral leg fasciotomy wound.  01/06/2018: 1. CPT 13160-Secondary closure of right leg fasciotomy wound 2. CPT 97605-Incisional wound vac placement  01/08/2018: 1.CPT 27536-Open reduction internal fixation of right bicondylar tibial plateau fracture 2. CPT 27540-Open reduction internal fixation of right tibial tuberosity 3. CPT 20694-Removal of external fixator 4. CPT 97605-Incisional wound vac placement  Discharged Condition: good  Hospital Course: The patient was admitted and underwent external fixation and 4 compartment fasciotomy release by Dr. Magnus Ivan.  I took over his care.  After that he underwent repeat irrigation of his fasciotomy wounds with closure.  He then finally had his open reduction internal fixation on August 30.  He did well following this.  His pain was well controlled with oral medications.  He was tolerating regular diet.  He was seen and evaluated by physical therapy and was found fit for discharge home.  The patient was discharged on postoperative day 2 from his definitive ORIF.  Consults:  None  Significant Diagnostic Studies: None  Treatments: surgery: as above  Discharge Exam: General: No acute distress awake alert and oriented Right lower extremity: Incisions are clean dry and intact.  They were redressed at bedside.  Compartments are soft compressible.  Active ankle dorsiflexion plantarflexion all is weak and pain limited.  He has endorses sensation to the dorsum and plantar aspect of his foot.  He is warm well-perfused foot.  Disposition: Discharge disposition: 01-Home or Self Care        Allergies as of 01/10/2018   No Known Allergies     Medication List    TAKE these medications   aspirin EC 325 MG tablet Take 1 tablet (325 mg total) by mouth daily.   methocarbamol 750 MG tablet Commonly known as:  ROBAXIN Take 1 tablet (750 mg total) by mouth every 6 (six) hours as needed for muscle spasms.   Oxycodone HCl 10 MG Tabs Take 1 tablet (10 mg total) by mouth every 4 (four) hours as needed for severe pain (pain score 7-10).            Durable Medical Equipment  (From admission, onward)         Start     Ordered   01/10/18 0734  For home use only DME Bedside commode  Once    Question:  Patient needs a bedside commode to treat with the following condition  Answer:  Closed bicondylar fracture of right tibial plateau   01/10/18 0734         Follow-up Information    Drako Maese, Gillie Manners, MD. Schedule an appointment as soon as possible for a visit in 2 week(s).   Specialty:  Orthopedic Surgery Contact information: [address] [place] [address]  [phone]           Discharge Instructions and Plan: Patient will be nonweightbearing to the right lower extremity.  He will return in 2 weeks for suture removal and x-rays.  He will take aspirin for DVT prophylaxis.  Signed:  Roby Lofts, MD [facility] 01/10/2018, 7:37 AM

## 2018-01-10 NOTE — Progress Notes (Signed)
01/10/2018 Patient suffers from R LE fracture, s/p repair- NWB post op which impairs their ability to perform daily activities like getting to the bathroom, being able to walk a functional distance in the home.  A walker alone will not resolve the issues with performing activities of daily living. A wheelchair will allow patient to safely perform daily activities.  The patient can self propel in the home or has a caregiver who can provide assistance.     [name], PT, DPT [phone]

## 2018-01-10 NOTE — Progress Notes (Signed)
Provided discharge education/instructions to Pt and Pt's mother, all questions and concerns addressed, Pt to discharge home with belongings.

## 2018-01-10 NOTE — Progress Notes (Signed)
DME at bedside, all questions and concerns addressed, Pt discharge home with belongings accompanied by mother.

## 2018-01-12 ENCOUNTER — Encounter (HOSPITAL_COMMUNITY): Payer: Self-pay | Admitting: Student

## 2019-12-21 IMAGING — RF DG C-ARM 61-120 MIN
1 series · 7 of 7 positions shown · non-contrast
Comparison: Right knee radiographs 01/06/2018

CLINICAL DATA: Lateral tibial plateau fracture.

EXAM:
DG C-ARM 61-120 MIN; RIGHT KNEE - COMPLETE 4+ VIEW

[Series 1: run · 7 of 7 slices shown]
[im 1/7]
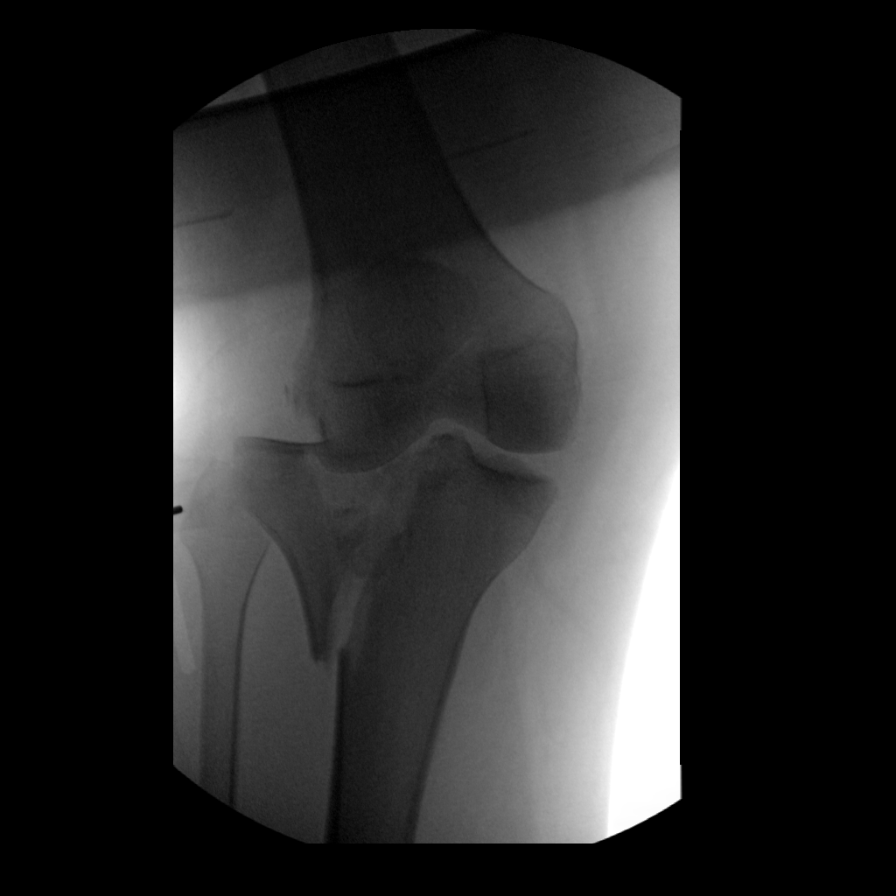
[im 2/7]
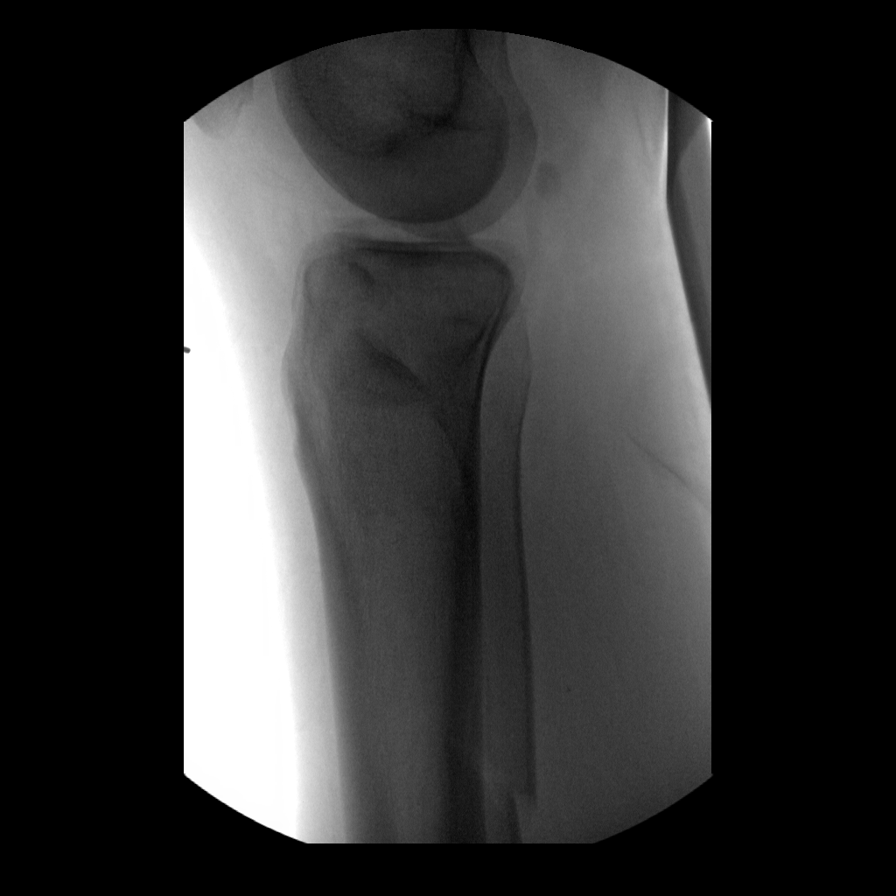
[im 3/7]
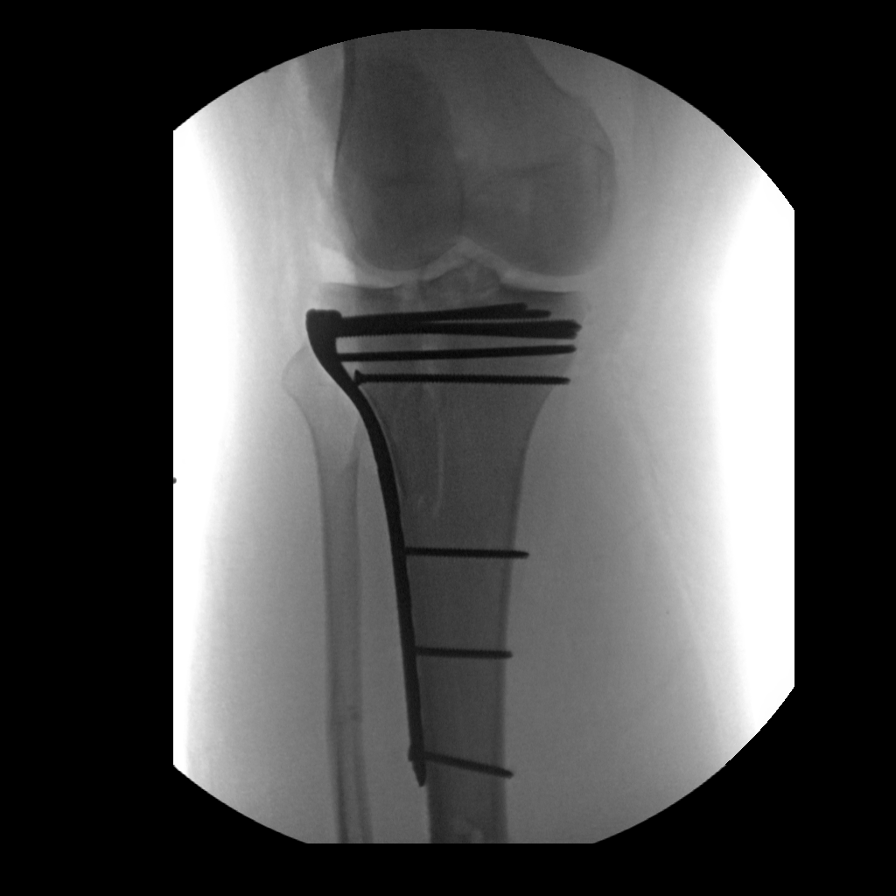
[im 4/7]
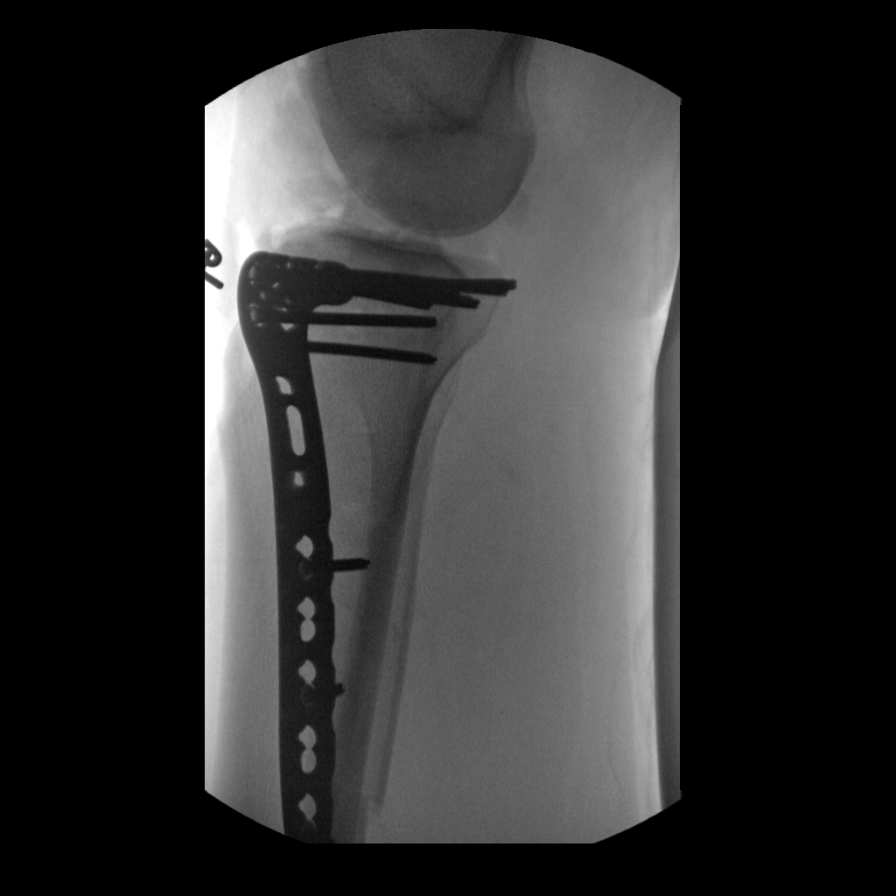
[im 5/7]
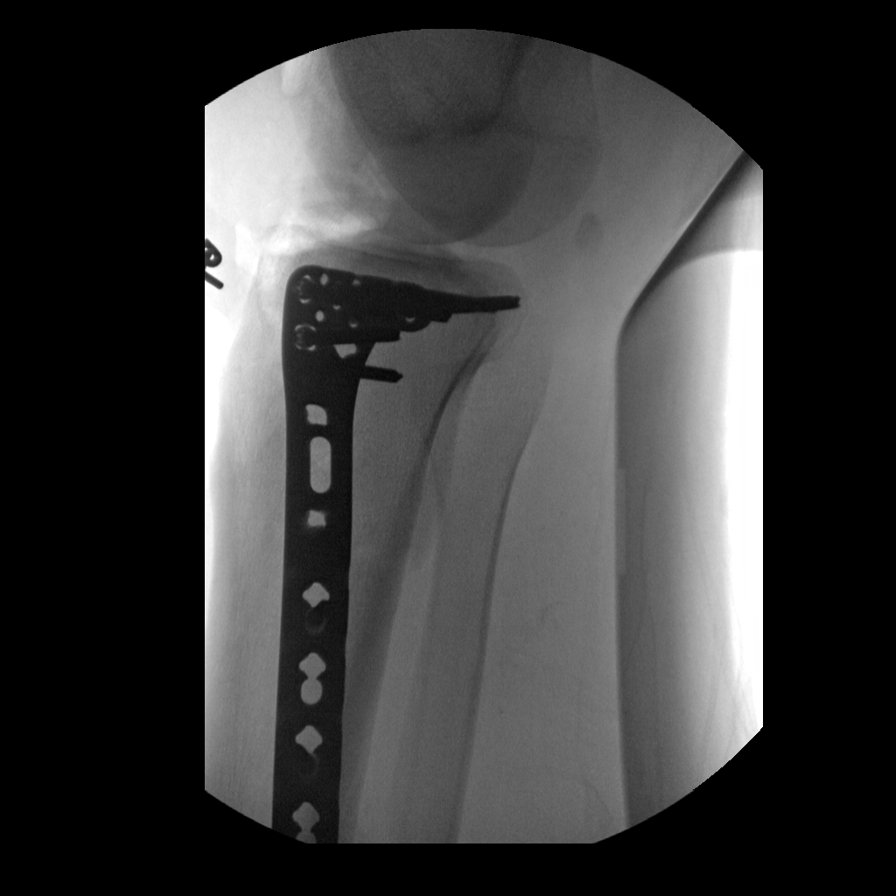
[im 6/7]
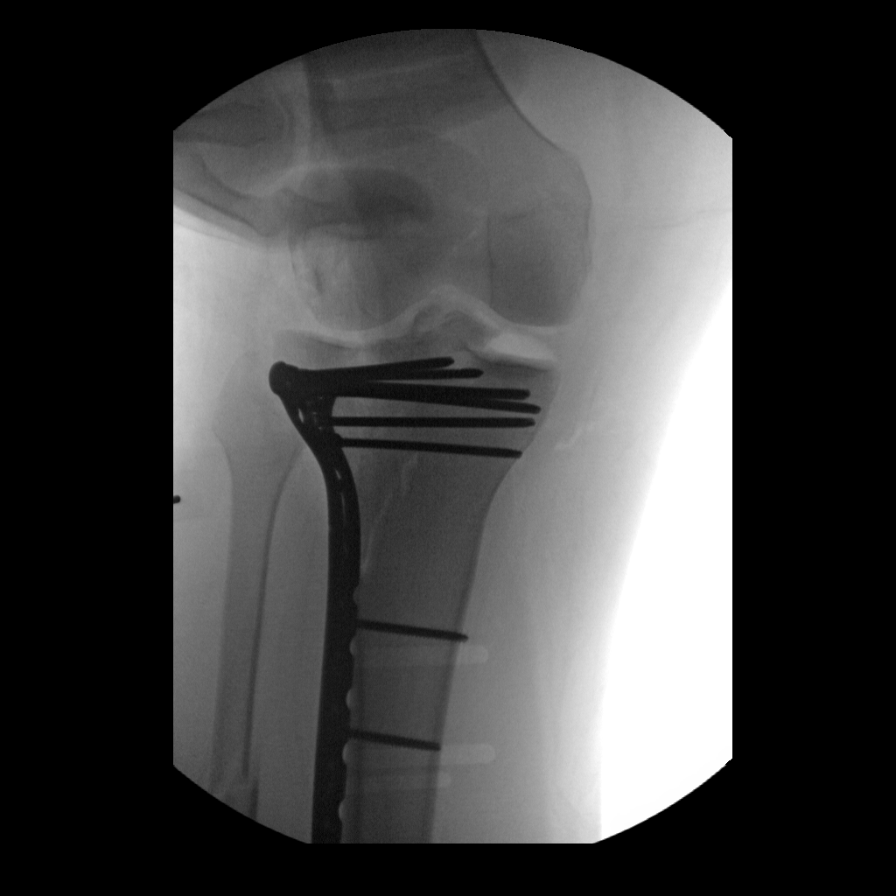
[im 7/7]
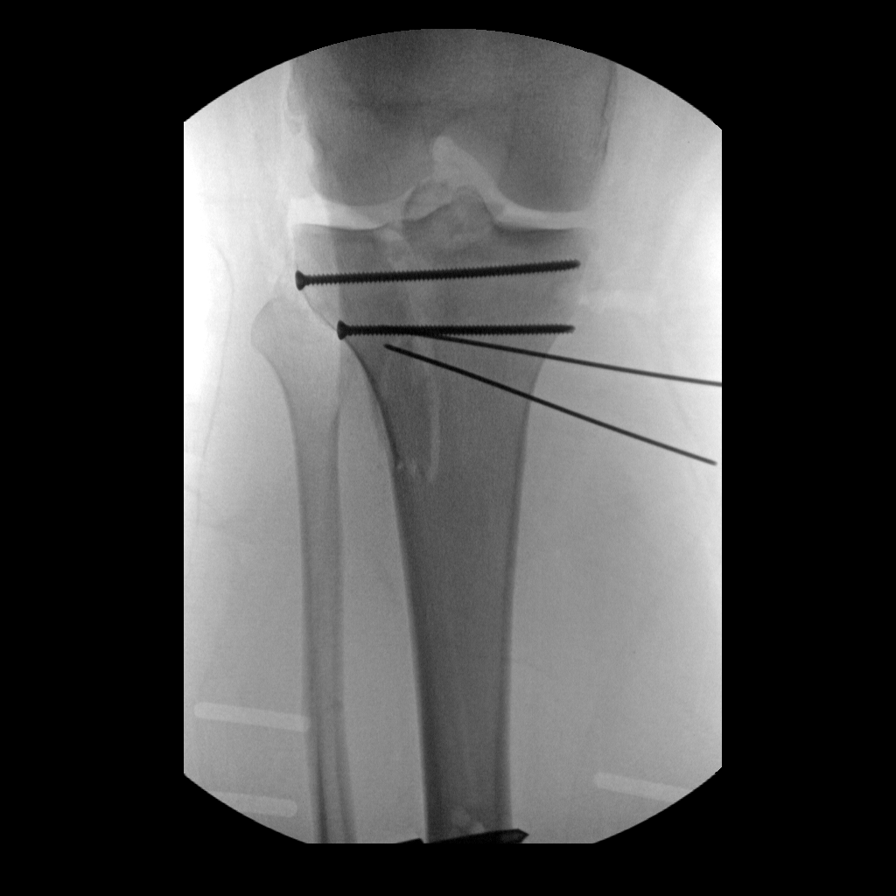

[7 of 7 positions shown; findings below may reference images not displayed]

FLUOROSCOPY TIME:  Fluoroscopy Time:  4 minutes 37 seconds

Number of Acquired Spot Images: 7
FINDINGS: Intraoperative imaging demonstrates lateral plate and screw fixation
of a comminuted left right lateral tibial plateau fracture. Gross
anatomic reduction is achieved. External fixators removed.
IMPRESSION: ORIF of comminuted lateral tibial plateau fracture without
radiographic complication.
